# Patient Record
Sex: Male | Born: 1965
Health system: Southern US, Community
[De-identification: ages and names within clinical notes are randomized; demographics above are authoritative.]

## PROBLEM LIST (undated history)

## (undated) DIAGNOSIS — E7800 Pure hypercholesterolemia, unspecified: Secondary | ICD-10-CM

## (undated) DIAGNOSIS — I10 Essential (primary) hypertension: Secondary | ICD-10-CM

## (undated) DIAGNOSIS — K219 Gastro-esophageal reflux disease without esophagitis: Secondary | ICD-10-CM

## (undated) DIAGNOSIS — D869 Sarcoidosis, unspecified: Secondary | ICD-10-CM

## (undated) HISTORY — PX: HERNIA REPAIR: SHX51

## (undated) HISTORY — PX: FOOT SURGERY: SHX648

---

## 1999-06-04 ENCOUNTER — Emergency Department (HOSPITAL_COMMUNITY): Admission: EM | Admit: 1999-06-04 | Discharge: 1999-06-04 | Payer: Self-pay | Admitting: Emergency Medicine

## 2002-10-03 ENCOUNTER — Inpatient Hospital Stay (HOSPITAL_COMMUNITY): Admission: RE | Admit: 2002-10-03 | Discharge: 2002-10-06 | Payer: Self-pay | Admitting: Orthopedic Surgery

## 2010-10-16 ENCOUNTER — Emergency Department (HOSPITAL_BASED_OUTPATIENT_CLINIC_OR_DEPARTMENT_OTHER)
Admission: EM | Admit: 2010-10-16 | Discharge: 2010-10-17 | Disposition: A | Discharge status: Home / Self Care | Payer: Managed Care, Other (non HMO) | Attending: Emergency Medicine | Admitting: Emergency Medicine

## 2010-10-16 ENCOUNTER — Emergency Department (INDEPENDENT_AMBULATORY_CARE_PROVIDER_SITE_OTHER): Payer: Managed Care, Other (non HMO)

## 2010-10-16 DIAGNOSIS — R1310 Dysphagia, unspecified: Secondary | ICD-10-CM | POA: Insufficient documentation

## 2010-10-16 DIAGNOSIS — R109 Unspecified abdominal pain: Secondary | ICD-10-CM | POA: Insufficient documentation

## 2010-10-16 DIAGNOSIS — R0602 Shortness of breath: Secondary | ICD-10-CM

## 2010-10-16 DIAGNOSIS — R0989 Other specified symptoms and signs involving the circulatory and respiratory systems: Secondary | ICD-10-CM | POA: Insufficient documentation

## 2010-10-16 DIAGNOSIS — R0789 Other chest pain: Secondary | ICD-10-CM | POA: Insufficient documentation

## 2010-10-16 DIAGNOSIS — R0609 Other forms of dyspnea: Secondary | ICD-10-CM | POA: Insufficient documentation

## 2010-10-16 DIAGNOSIS — K219 Gastro-esophageal reflux disease without esophagitis: Secondary | ICD-10-CM | POA: Insufficient documentation

## 2010-10-16 DIAGNOSIS — R079 Chest pain, unspecified: Secondary | ICD-10-CM

## 2010-10-16 LAB — COMPREHENSIVE METABOLIC PANEL
ALT: 46 U/L (ref 0–53)
AST: 36 U/L (ref 0–37)
Alkaline Phosphatase: 65 U/L (ref 39–117)
CO2: 27 mEq/L (ref 19–32)
GFR calc non Af Amer: >60 mL/min (ref >60)
Glucose, Bld: 117 mg/dL — ABNORMAL HIGH (ref 70–99)
Potassium: 4.4 mEq/L (ref 3.5–5.1)
Sodium: 143 mEq/L (ref 135–145)
Total Protein: 7.8 g/dL (ref 6.0–8.3)

## 2010-10-16 LAB — POCT CARDIAC MARKERS
CKMB, poc: 1.7 ng/mL (ref 1.0–8.0)
Myoglobin, poc: 119 ng/mL (ref 12–200)
Troponin i, poc: <0.05 ng/mL (ref 0.00–0.09)
Troponin i, poc: <0.05 ng/mL (ref 0.00–0.09)

## 2010-10-16 LAB — DIFFERENTIAL
Basophils Absolute: 0 10*3/uL (ref 0.0–0.1)
Lymphocytes Relative: 26 % (ref 12–46)
Neutro Abs: 5.2 10*3/uL (ref 1.7–7.7)
Neutrophils Relative %: 63 % (ref 43–77)

## 2010-10-16 LAB — CBC
HCT: 41.4 % (ref 39.0–52.0)
Hemoglobin: 13.9 g/dL (ref 13.0–17.0)
WBC: 8.3 10*3/uL (ref 4.0–10.5)

## 2010-10-16 LAB — LIPASE, BLOOD: Lipase: 237 U/L (ref 23–300)

## 2011-09-10 ENCOUNTER — Emergency Department (HOSPITAL_BASED_OUTPATIENT_CLINIC_OR_DEPARTMENT_OTHER)
Admission: EM | Admit: 2011-09-10 | Discharge: 2011-09-10 | Disposition: A | Discharge status: Home / Self Care | Payer: Managed Care, Other (non HMO) | Attending: Emergency Medicine | Admitting: Emergency Medicine

## 2011-09-10 ENCOUNTER — Encounter (HOSPITAL_BASED_OUTPATIENT_CLINIC_OR_DEPARTMENT_OTHER): Payer: Self-pay | Admitting: *Deleted

## 2011-09-10 ENCOUNTER — Emergency Department (INDEPENDENT_AMBULATORY_CARE_PROVIDER_SITE_OTHER): Payer: Managed Care, Other (non HMO)

## 2011-09-10 DIAGNOSIS — R5383 Other fatigue: Secondary | ICD-10-CM

## 2011-09-10 DIAGNOSIS — R0989 Other specified symptoms and signs involving the circulatory and respiratory systems: Secondary | ICD-10-CM

## 2011-09-10 DIAGNOSIS — J069 Acute upper respiratory infection, unspecified: Secondary | ICD-10-CM | POA: Insufficient documentation

## 2011-09-10 DIAGNOSIS — R05 Cough: Secondary | ICD-10-CM

## 2011-09-10 DIAGNOSIS — R630 Anorexia: Secondary | ICD-10-CM

## 2011-09-10 DIAGNOSIS — E7800 Pure hypercholesterolemia, unspecified: Secondary | ICD-10-CM | POA: Insufficient documentation

## 2011-09-10 HISTORY — DX: Sarcoidosis, unspecified: D86.9

## 2011-09-10 HISTORY — DX: Pure hypercholesterolemia, unspecified: E78.00

## 2011-09-10 MED ORDER — ALBUTEROL SULFATE HFA 108 (90 BASE) MCG/ACT IN AERS
2.0000 | INHALATION_SPRAY | RESPIRATORY_TRACT | Status: DC | PRN
  Administered 2011-09-10: 2 via RESPIRATORY_TRACT
  Filled 2011-09-10: qty 6.7

## 2011-09-10 NOTE — ED Provider Notes (Signed)
History     CSN: [id number]  Arrival date & time 09/10/11  0940   First MD Initiated Contact with Patient 09/10/11 [phone]      Chief Complaint  Patient presents with  . URI    (Consider location/radiation/quality/duration/timing/severity/associated sxs/prior treatment) HPI Pt has had a cough for the last several days.  Bringing up brown sputum.  He is concerned about pneumonia.  Patient has been having body aches. He also has had what he believed to be episodes of wheezing. Patient does smoke but does not have known history of bronchitis or COPD. He has not noticed any rash. He has not had any neck stiffness. Patient was seen at [facility] yesterday and was given a prescription for cough medication. He is concerned because it is not really helping and he is worried he could have pneumonia. Past Medical History  Diagnosis Date  . High cholesterol   . Sarcoidosis     Past Surgical History  Procedure Date  . Hernia repair   . Foot surgery     No family history on file.  History  Substance Use Topics  . Smoking status: Not on file  . Smokeless tobacco: Not on file  . Alcohol Use: No      Review of Systems  HENT: Negative for neck pain.   Respiratory: Positive for cough. Negative for shortness of breath.   Musculoskeletal: Negative for back pain.  All other systems reviewed and are negative.    Allergies  Review of patient's allergies indicates no known allergies.  Home Medications   Current Outpatient Rx  Name Route Sig Dispense Refill  . CRESTOR PO Oral Take by mouth.      BP 132/85  Pulse 67  Temp(Src) 98.7 F (37.1 C) (Oral)  Resp 24  SpO2 98%  Physical Exam  Nursing note and vitals reviewed. Constitutional: He appears well-developed and well-nourished. No distress.  HENT:  Head: Normocephalic and atraumatic.  Right Ear: External ear normal.  Left Ear: External ear normal.  Eyes: Conjunctivae are normal. Right eye exhibits no discharge.  Left eye exhibits no discharge. No scleral icterus.  Neck: Neck supple. No tracheal deviation present.  Cardiovascular: Normal rate, regular rhythm and intact distal pulses.   Pulmonary/Chest: Effort normal and breath sounds normal. No stridor. No respiratory distress. He has no wheezes. He has no rales.  Abdominal: Soft. Bowel sounds are normal. He exhibits no distension. There is no tenderness. There is no rebound and no guarding.  Musculoskeletal: He exhibits no edema and no tenderness.  Neurological: He is alert. He has normal strength. No sensory deficit. Cranial nerve deficit:  no gross defecits noted. He exhibits normal muscle tone. He displays no seizure activity. Coordination normal.  Skin: Skin is warm and dry. No rash noted.  Psychiatric: He has a normal mood and affect.    ED Course  Procedures (including critical care time)  Labs Reviewed - No data to display Dg Chest 2 View  09/10/2011  *RADIOLOGY REPORT*  Clinical Data: Productive cough and congestion.  Fatigue. Decreased appetite.  CHEST - 2 VIEW  Comparison: 10/16/2010  Findings: Midline trachea.  Normal heart size and mediastinal contours. No pleural effusion or pneumothorax.  Mildly elevated right hemidiaphragm.  Resultant low lung volumes.  This accentuates pulmonary interstitium.  Mild volume loss at the right lung base. Lungs are otherwise clear.  IMPRESSION: Low lung volumes with mild right hemidiaphragm elevation and right basilar volume loss. No acute findings.  Original Report  Authenticated By: Consuello Bossier, M.D.     1. URI (upper respiratory infection)       MDM  Patient may have a viral illness possibly influenza. Overall he does not appear to be in any significant distress. There is no evidence of pneumonia on his x-ray. On my exam he is not wheezing currently although it is possible he has had some episodes according his complaints. Your prescription for an inhaler. I counseled him on smoking cessation  patient is encouraged to follow up with her primary care Dr. if not getting any better. I did describe supportive treatment of pneumonia and signs that should prompt return to emergency department.        Celene Kras, MD 09/10/11 1044

## 2011-09-10 NOTE — ED Notes (Signed)
Flu symptoms x 3 days. Productive cough with brown sputum. Aching all over. Fever and headache. Was seen at [facility] yesterday and given Rx for cough medication.

## 2014-03-15 ENCOUNTER — Emergency Department (HOSPITAL_BASED_OUTPATIENT_CLINIC_OR_DEPARTMENT_OTHER)
Admission: EM | Admit: 2014-03-15 | Discharge: 2014-03-16 | Disposition: A | Discharge status: Home / Self Care | Payer: Managed Care, Other (non HMO) | Attending: Emergency Medicine | Admitting: Emergency Medicine

## 2014-03-15 ENCOUNTER — Encounter (HOSPITAL_BASED_OUTPATIENT_CLINIC_OR_DEPARTMENT_OTHER): Payer: Self-pay | Admitting: Emergency Medicine

## 2014-03-15 ENCOUNTER — Emergency Department (HOSPITAL_BASED_OUTPATIENT_CLINIC_OR_DEPARTMENT_OTHER): Payer: Managed Care, Other (non HMO)

## 2014-03-15 DIAGNOSIS — K219 Gastro-esophageal reflux disease without esophagitis: Secondary | ICD-10-CM | POA: Insufficient documentation

## 2014-03-15 DIAGNOSIS — Z8619 Personal history of other infectious and parasitic diseases: Secondary | ICD-10-CM | POA: Insufficient documentation

## 2014-03-15 DIAGNOSIS — Z79899 Other long term (current) drug therapy: Secondary | ICD-10-CM | POA: Insufficient documentation

## 2014-03-15 DIAGNOSIS — Z7982 Long term (current) use of aspirin: Secondary | ICD-10-CM | POA: Insufficient documentation

## 2014-03-15 DIAGNOSIS — R062 Wheezing: Secondary | ICD-10-CM | POA: Insufficient documentation

## 2014-03-15 DIAGNOSIS — I10 Essential (primary) hypertension: Secondary | ICD-10-CM | POA: Insufficient documentation

## 2014-03-15 DIAGNOSIS — Z87891 Personal history of nicotine dependence: Secondary | ICD-10-CM | POA: Insufficient documentation

## 2014-03-15 DIAGNOSIS — E7800 Pure hypercholesterolemia, unspecified: Secondary | ICD-10-CM | POA: Insufficient documentation

## 2014-03-15 HISTORY — DX: Essential (primary) hypertension: I10

## 2014-03-15 LAB — CBC
HEMATOCRIT: 43.4 % (ref 39.0–52.0)
Hemoglobin: 14.6 g/dL (ref 13.0–17.0)
MCH: 28.2 pg (ref 26.0–34.0)
MCHC: 33.6 g/dL (ref 30.0–36.0)
MCV: 83.8 fL (ref 78.0–100.0)
Platelets: 281 10*3/uL (ref 150–400)
RBC: 5.18 MIL/uL (ref 4.22–5.81)
RDW: 14.7 % (ref 11.5–15.5)
WBC: 8 10*3/uL (ref 4.0–10.5)

## 2014-03-15 LAB — TROPONIN I: Troponin I: <0.3 ng/mL (ref <0.30)

## 2014-03-15 LAB — BASIC METABOLIC PANEL
Anion gap: 18 — ABNORMAL HIGH (ref 5–15)
BUN: 15 mg/dL (ref 6–23)
CHLORIDE: 103 meq/L (ref 96–112)
CO2: 23 meq/L (ref 19–32)
CREATININE: 1.3 mg/dL (ref 0.50–1.35)
Calcium: 9.7 mg/dL (ref 8.4–10.5)
GFR calc Af Amer: 75 mL/min — ABNORMAL LOW (ref >90)
GFR calc non Af Amer: 65 mL/min — ABNORMAL LOW (ref >90)
Glucose, Bld: 102 mg/dL — ABNORMAL HIGH (ref 70–99)
Potassium: 3.8 mEq/L (ref 3.7–5.3)
Sodium: 144 mEq/L (ref 137–147)

## 2014-03-15 MED ORDER — GI COCKTAIL ~~LOC~~
30.0000 mL | Freq: Once | ORAL | Status: AC
  Administered 2014-03-15: 30 mL via ORAL
  Filled 2014-03-15: qty 30

## 2014-03-15 NOTE — ED Provider Notes (Signed)
CSN: [id number]     Arrival date & time 03/15/14  2152 History  This chart was scribed for Aletha Allebach Smitty CordsK Kawehi Hostetter-Rasch, MD, by Yevette EdwardsAngela Bracken, ED Scribe. This patient was seen in room MH07/MH07 and the patient's care was started at 11:04 PM.    None    Chief Complaint  Patient presents with  . Chest Pain    Patient is a 48 y.o. male presenting with chest pain. The history is provided by the patient. No language interpreter was used.  Chest Pain Pain location:  Substernal area Pain quality: tightness   Pain radiates to:  Does not radiate Pain radiates to the back: no   Pain severity:  Moderate Onset quality:  Sudden Duration:  90 minutes Timing:  Constant Progression:  Improving Chronicity:  Recurrent Context: eating   Relieved by:  Nothing Worsened by:  Nothing tried Ineffective treatments:  None tried Associated symptoms: cough and nausea   Associated symptoms: no fever and no palpitations   Associated symptoms comment:  Wheezing Risk factors: hypertension and male sex    HPI Comments: Erroll LunaLeroy Saephan is a 48 y.o. male, with a h/o GERD, HTN, and hyperlipidemia, who presents to the Emergency Department complaining of central chest tightness which began an hour ago ago while he was sitting at a bowling alley. The pt reports he had eaten mild chicken wings prior to development of the chest tightness.  Mr. Jenne CampusMcQueen also endorses experiencing cough, wheezing. The pt reports the wheezing is not a new symptom. He denies abdominal pain. The pt reports he takes medication for GERD, and he has h/o GERD. He also denies a h/o COPD, asthma, or a recent cold. He also denies lower extremity swelling, recent travels, or immobilization. The pt is a former smoker.   Past Medical History  Diagnosis Date  . High cholesterol   . Sarcoidosis   . Hypertension    Past Surgical History  Procedure Laterality Date  . Hernia repair    . Foot surgery     No family history on file. History  Substance Use  Topics  . Smoking status: Former Games developermoker  . Smokeless tobacco: Not on file  . Alcohol Use: No    Review of Systems  Constitutional: Negative for fever.  Respiratory: Positive for cough and wheezing.   Cardiovascular: Positive for chest pain. Negative for palpitations and leg swelling.  Gastrointestinal: Positive for nausea.  All other systems reviewed and are negative.   Allergies  Review of patient's allergies indicates no known allergies.  Home Medications   Prior to Admission medications   Medication Sig Start Date End Date Taking? Authorizing Provider  amLODipine (NORVASC) 5 MG tablet Take 5 mg by mouth daily.   Yes Historical Provider, MD  aspirin 81 MG tablet Take 160 mg by mouth daily. For headache    Historical Provider, MD  Dextromethorphan-Guaifenesin [facility](MUCINEX COUGH FOR KIDS) 5-100 MG/5ML LIQD Take by mouth every 6 (six) hours as needed.    Historical Provider, MD  HYDROcodone-homatropine (HYCODAN) 5-1.5 MG/5ML syrup Take 5 mLs by mouth every 6 (six) hours as needed. For cough    Historical Provider, MD  Pseudoeph-Doxylamine-DM-APAP (NYQUIL) 60-7.01-21-999 MG/30ML LIQD Take by mouth at bedtime as needed. For rest    Historical Provider, MD  rosuvastatin (CRESTOR) 20 MG tablet Take 20 mg by mouth daily.    Historical Provider, MD   Triage Vitals: BP 146/87  Pulse 94  Temp(Src) 97.9 F (36.6 C) (Oral)  Resp 18  Ht 5\' 11"  (  1.803 m)  Wt 240 lb (108.863 kg)  BMI 33.49 kg/m2  SpO2 96%  Physical Exam  Constitutional: He is oriented to person, place, and time. He appears well-developed. No distress.  HENT:  Head: Normocephalic.  Mouth/Throat: Oropharynx is clear and moist and mucous membranes are normal. No oropharyngeal exudate, posterior oropharyngeal edema or posterior oropharyngeal erythema.  Eyes: EOM are normal. Pupils are equal, round, and reactive to light.  Neck: Normal range of motion. Neck supple.  No stridor.   Cardiovascular: Normal rate, regular rhythm and  normal heart sounds.   No murmur heard. Pulmonary/Chest: Effort normal. No accessory muscle usage. No respiratory distress. He has no decreased breath sounds. He has no wheezes. He has no rhonchi. He has no rales.  Abdominal: Soft. Bowel sounds are increased. There is no tenderness. There is no rebound and no guarding.    Musculoskeletal: Normal range of motion. He exhibits no edema.  Lymphadenopathy:    He has no cervical adenopathy.  Neurological: He is alert and oriented to person, place, and time.  Skin: Skin is warm and dry.  Psychiatric: He has a normal mood and affect.    ED Course  Procedures (including critical care time)  DIAGNOSTIC STUDIES: Oxygen Saturation is 96% on room air, normal by my interpretation.    COORDINATION OF CARE:  11:15 PM- Discussed treatment plan with patient, and the patient agreed to the plan.   Labs Review Labs Reviewed  BASIC METABOLIC PANEL - Abnormal; Notable for the following:    Glucose, Bld 102 (*)    GFR calc non Af Amer 65 (*)    GFR calc Af Amer 75 (*)    Anion gap 18 (*)    All other components within normal limits  CBC  TROPONIN I    Imaging Review Dg Chest 2 View  03/15/2014   CLINICAL DATA:  Central chest tightness radiating into the left arm. Shortness of breath and nausea.  EXAM: CHEST  2 VIEW  COMPARISON:  09/10/2011.  FINDINGS: The heart size and mediastinal contours are stable. There are stable low lung volumes with bibasilar atelectasis. No confluent airspace opacity, pleural effusion or pneumothorax is seen. The osseous structures appear unchanged.  IMPRESSION: Stable chest.  No acute cardiopulmonary process.   Electronically Signed   By: Roxy Horseman M.D.   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  Symptoms resolved with GI cocktail.  Ddimer is negative in a very low risk patient excluding PE.  Ruled out for ACS with a negative EKG and serial troponins with change to protonix and add carafate.  Have recommended a gerd friendly diet and close follow up.  Return for DOE CP with radiation diaphoresis or any concerns.    I personally performed the services described in this documentation, which was scribed in my presence. The recorded information has been reviewed and is accurate.    Jasmine Awe, MD 03/16/14 [phone]

## 2014-03-15 NOTE — ED Notes (Signed)
Pt c/o central chest tightness radiating to the left that began 30 min pta while [place] the bowling [place]. Pt c/o SOB, nausea and dizziness.

## 2014-03-15 NOTE — ED Notes (Signed)
MD at bedside. 

## 2014-03-16 ENCOUNTER — Encounter (HOSPITAL_BASED_OUTPATIENT_CLINIC_OR_DEPARTMENT_OTHER): Payer: Self-pay | Admitting: Emergency Medicine

## 2014-03-16 LAB — D-DIMER, QUANTITATIVE: D-Dimer, Quant: <0.27 ug/mL-FEU (ref 0.00–0.48)

## 2014-03-16 LAB — TROPONIN I

## 2014-03-16 MED ORDER — PANTOPRAZOLE SODIUM 20 MG PO TBEC: 20.0000 mg | DELAYED_RELEASE_TABLET | Freq: Every day | ORAL | Status: DC

## 2014-03-16 MED ORDER — KETOROLAC TROMETHAMINE 60 MG/2ML IM SOLN
60.0000 mg | Freq: Once | INTRAMUSCULAR | Status: AC
  Administered 2014-03-16: 60 mg via INTRAMUSCULAR
  Filled 2014-03-16: qty 2

## 2014-03-16 MED ORDER — SUCRALFATE 1 GM/10ML PO SUSP: 1.0000 g | Freq: Three times a day (TID) | ORAL | Status: DC

## 2014-11-22 ENCOUNTER — Emergency Department (HOSPITAL_BASED_OUTPATIENT_CLINIC_OR_DEPARTMENT_OTHER)
Admission: EM | Admit: 2014-11-22 | Discharge: 2014-11-22 | Disposition: A | Discharge status: Home / Self Care | Payer: Managed Care, Other (non HMO) | Attending: Emergency Medicine | Admitting: Emergency Medicine

## 2014-11-22 ENCOUNTER — Encounter (HOSPITAL_BASED_OUTPATIENT_CLINIC_OR_DEPARTMENT_OTHER): Payer: Self-pay

## 2014-11-22 DIAGNOSIS — Z9889 Other specified postprocedural states: Secondary | ICD-10-CM | POA: Insufficient documentation

## 2014-11-22 DIAGNOSIS — Z87891 Personal history of nicotine dependence: Secondary | ICD-10-CM | POA: Insufficient documentation

## 2014-11-22 DIAGNOSIS — R1030 Lower abdominal pain, unspecified: Secondary | ICD-10-CM | POA: Diagnosis not present

## 2014-11-22 DIAGNOSIS — Z8639 Personal history of other endocrine, nutritional and metabolic disease: Secondary | ICD-10-CM | POA: Insufficient documentation

## 2014-11-22 DIAGNOSIS — I10 Essential (primary) hypertension: Secondary | ICD-10-CM | POA: Insufficient documentation

## 2014-11-22 DIAGNOSIS — R1900 Intra-abdominal and pelvic swelling, mass and lump, unspecified site: Secondary | ICD-10-CM | POA: Diagnosis present

## 2014-11-22 DIAGNOSIS — R1031 Right lower quadrant pain: Secondary | ICD-10-CM

## 2014-11-22 DIAGNOSIS — Z7982 Long term (current) use of aspirin: Secondary | ICD-10-CM | POA: Insufficient documentation

## 2014-11-22 NOTE — ED Notes (Signed)
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

## 2014-11-22 NOTE — ED Provider Notes (Signed)
CSN: [id number]     Arrival date & time 11/22/14  1121 History   First MD Initiated Contact with Patient 11/22/14 1254     Chief Complaint  Patient presents with  . Groin Swelling     (Consider location/radiation/quality/duration/timing/severity/associated sxs/prior Treatment) HPI Clifford Daniels is a 49 y.o. male with a history of umbilical and right groin hernia surgery in January 2016 comes in for evaluation of groin discomfort. 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 He characterizes the discomfort as a intermittent, sharp shooting pain that he rates as a 5/10. He reports taking ibuprofen for this discomfort which helps somewhat. He has not followed up with his surgeon for this complaint. He denies any abdominal pain, fevers, nausea or vomiting, difficulties urinating or having bowel movements. No other aggravating or modifying factors.  Past Medical History  Diagnosis Date  . High cholesterol   . Sarcoidosis   . Hypertension    Past Surgical History  Procedure Laterality Date  . Hernia repair    . Foot surgery     No family history on file. History  Substance Use Topics  . Smoking status: Former Games developer  . Smokeless tobacco: Not on file  . Alcohol Use: No    Review of Systems A 10 point review of systems was completed and was negative except for pertinent positives and negatives as mentioned in the history of present illness     Allergies  Review of patient's allergies indicates no known allergies.  Home Medications   Prior to Admission medications   Medication Sig Start Date End Date Taking? Authorizing Provider  OMEPRAZOLE PO Take by mouth.   Yes Historical Provider, MD  aspirin 81 MG tablet Take 160 mg by mouth daily. For headache    Historical Provider, MD   BP 156/88 mmHg  Pulse 76  Temp(Src) 98.6 F  (37 C) (Oral)  Resp 18  Ht  (1.803 m)  Wt 244 lb (110.678 kg)  BMI 34.05 kg/m2  SpO2 96% Physical Exam  Constitutional: He is oriented to person, place, and time. He appears well-developed and well-nourished.  HENT:  Head: Normocephalic and atraumatic.  Mouth/Throat: Oropharynx is clear and moist.  Eyes: Conjunctivae are normal. Pupils are equal, round, and reactive to light. Right eye exhibits no discharge. Left eye exhibits no discharge. No scleral icterus.  Neck: Neck supple.  Cardiovascular: Normal rate, regular rhythm and normal heart sounds.   Pulmonary/Chest: Effort normal and breath sounds normal. No respiratory distress. He has no wheezes. He has no rales.  Abdominal: Soft. He exhibits no distension and no mass. There is no tenderness. There is no rebound and no guarding.  Genitourinary:  No evidence of inguinal hernia. No appreciation of direct or indirect hernias. No obvious bulges, lesions or other deformities noted.  Musculoskeletal: He exhibits no tenderness.  Neurological: He is alert and oriented to person, place, and time.  Cranial Nerves II-XII grossly intact  Skin: Skin is warm and dry. No rash noted.  Psychiatric: He has a normal mood and affect.  Nursing note and vitals reviewed.   ED Course  Procedures (including critical care time) Labs Review Labs Reviewed - No data to display  Imaging Review No results found.   EKG Interpretation None     Meds given in ED:  Medications - No data to display  Discharge Medication  List as of 11/22/2014  1:23 PM     Filed Vitals:   11/22/14 1134 11/22/14 1324  BP: 150/83 156/88  Pulse: 94 76  Temp: 98.6 F (37 C)   TempSrc: Oral   Resp: 18 18  Height: 5\' 11"  (1.803 m)   Weight: 244 lb (110.678 kg)   SpO2: 96% 96%    MDM  Vitals stable - WNL -afebrile Pt resting comfortably in ED.  PE--benign abdominal and GU exam.  DDX--patient with mild right groin discomfort following hernia repair in  January. Provided reassurance and discussed follow-up with his surgeon for further evaluation and management of symptoms. No evidence of other acute or emergent pathology at this time. Discussed reducing strenuous activity, i.e. offloading heavy objects and continue using anti-inflammatories for discomfort.  I discussed all relevant lab findings and imaging results with pt and they verbalized understanding. Discussed f/u with PCP within 48 hrs and return precautions, pt very amenable to plan. Prior to patient discharge, I discussed and reviewed this case with Dr.Zackaowski  Final diagnoses:  Groin discomfort, right       Joycie PeekBenjamin Tamer Baughman, PA-C 11/22/14 1511  Vanetta MuldersScott Zackowski, MD 11/28/14 0002

## 2015-01-01 ENCOUNTER — Emergency Department (HOSPITAL_BASED_OUTPATIENT_CLINIC_OR_DEPARTMENT_OTHER): Payer: Managed Care, Other (non HMO)

## 2015-01-01 ENCOUNTER — Encounter (HOSPITAL_BASED_OUTPATIENT_CLINIC_OR_DEPARTMENT_OTHER): Payer: Self-pay

## 2015-01-01 ENCOUNTER — Emergency Department (HOSPITAL_BASED_OUTPATIENT_CLINIC_OR_DEPARTMENT_OTHER)
Admission: EM | Admit: 2015-01-01 | Discharge: 2015-01-01 | Disposition: A | Discharge status: Home / Self Care | Payer: Managed Care, Other (non HMO) | Attending: Emergency Medicine | Admitting: Emergency Medicine

## 2015-01-01 DIAGNOSIS — Z7982 Long term (current) use of aspirin: Secondary | ICD-10-CM | POA: Diagnosis not present

## 2015-01-01 DIAGNOSIS — Z87891 Personal history of nicotine dependence: Secondary | ICD-10-CM | POA: Diagnosis not present

## 2015-01-01 DIAGNOSIS — Y9389 Activity, other specified: Secondary | ICD-10-CM | POA: Insufficient documentation

## 2015-01-01 DIAGNOSIS — S6992XA Unspecified injury of left wrist, hand and finger(s), initial encounter: Secondary | ICD-10-CM | POA: Diagnosis not present

## 2015-01-01 DIAGNOSIS — Z8639 Personal history of other endocrine, nutritional and metabolic disease: Secondary | ICD-10-CM | POA: Insufficient documentation

## 2015-01-01 DIAGNOSIS — S3992XA Unspecified injury of lower back, initial encounter: Secondary | ICD-10-CM | POA: Diagnosis not present

## 2015-01-01 DIAGNOSIS — Y92410 Unspecified street and highway as the place of occurrence of the external cause: Secondary | ICD-10-CM | POA: Diagnosis not present

## 2015-01-01 DIAGNOSIS — Z862 Personal history of diseases of the blood and blood-forming organs and certain disorders involving the immune mechanism: Secondary | ICD-10-CM | POA: Diagnosis not present

## 2015-01-01 DIAGNOSIS — S6991XA Unspecified injury of right wrist, hand and finger(s), initial encounter: Secondary | ICD-10-CM | POA: Diagnosis not present

## 2015-01-01 DIAGNOSIS — Y998 Other external cause status: Secondary | ICD-10-CM | POA: Insufficient documentation

## 2015-01-01 DIAGNOSIS — M25532 Pain in left wrist: Secondary | ICD-10-CM

## 2015-01-01 DIAGNOSIS — M25531 Pain in right wrist: Secondary | ICD-10-CM

## 2015-01-01 MED ORDER — IBUPROFEN 200 MG PO TABS
600.0000 mg | ORAL_TABLET | Freq: Once | ORAL | Status: AC
  Administered 2015-01-01: 600 mg via ORAL
  Filled 2015-01-01 (×2): qty 1

## 2015-01-01 MED ORDER — MELOXICAM 7.5 MG PO TABS: 7.5000 mg | ORAL_TABLET | Freq: Every day | ORAL | Status: DC

## 2015-01-01 MED ORDER — HYDROCODONE-ACETAMINOPHEN 5-325 MG PO TABS
1.0000 | ORAL_TABLET | Freq: Once | ORAL | Status: AC
  Administered 2015-01-01: 1 via ORAL
  Filled 2015-01-01: qty 1

## 2015-01-01 MED ORDER — TRAMADOL HCL 50 MG PO TABS
50.0000 mg | ORAL_TABLET | Freq: Four times a day (QID) | ORAL | Status: DC | PRN
Start: 2015-01-01 — End: 2017-04-14

## 2015-01-01 MED ORDER — METHOCARBAMOL 500 MG PO TABS: 500.0000 mg | ORAL_TABLET | Freq: Two times a day (BID) | ORAL | Status: DC
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
## 2017-04-14 ENCOUNTER — Encounter (HOSPITAL_BASED_OUTPATIENT_CLINIC_OR_DEPARTMENT_OTHER): Payer: Self-pay | Admitting: *Deleted

## 2017-04-14 ENCOUNTER — Emergency Department (HOSPITAL_BASED_OUTPATIENT_CLINIC_OR_DEPARTMENT_OTHER)
Admission: EM | Admit: 2017-04-14 | Discharge: 2017-04-14 | Disposition: A | Discharge status: Home / Self Care | Payer: Managed Care, Other (non HMO) | Attending: Emergency Medicine | Admitting: Emergency Medicine

## 2017-04-14 DIAGNOSIS — Z87891 Personal history of nicotine dependence: Secondary | ICD-10-CM | POA: Insufficient documentation

## 2017-04-14 DIAGNOSIS — R519 Headache, unspecified: Secondary | ICD-10-CM

## 2017-04-14 DIAGNOSIS — R51 Headache: Secondary | ICD-10-CM | POA: Insufficient documentation

## 2017-04-14 MED ORDER — FEXOFENADINE HCL 60 MG PO TABS: 60.0000 mg | ORAL_TABLET | Freq: Two times a day (BID) | ORAL | 0 refills | Status: AC

## 2017-04-14 MED ORDER — NAPROXEN 500 MG PO TABS: 500.0000 mg | ORAL_TABLET | Freq: Two times a day (BID) | ORAL | 0 refills | Status: DC

## 2017-04-14 NOTE — ED Triage Notes (Signed)
Intermittent pressure pain in head over the last 2 days.  Reports it is 'toothache' pain but not in teeth.

## 2017-04-14 NOTE — Discharge Instructions (Signed)
Please follow up with your Primary caregiver for persistent symptoms. If you develop worsening or new concerning symptoms you can return to the emergency department for re-evaluation.   Your blood pressure was elevated during today's visit. Please discuss this with your PCP during your follow-up appointment to determine if a medication adjustment/addition is needed for this

## 2017-04-14 NOTE — ED Provider Notes (Signed)
[facility]   CSN: [id number] Arrival date & time: 04/14/17  1809     History   Chief Complaint Chief Complaint  Patient presents with  . Headache    HPI Clifford Daniels is a 51 y.o. male who presents to the emergency department today for intermittent pain in his head over the last 2 days. This is a new problem. The patient states that anytime that he sneezes he feels a pain in the top of his right head that feels like a toothache, rate, lasts for <5seconds and then remits. The pain is not brought on by anything else. He has not taken anything for this. He denies any current pain. He notes that he has had itchy watery eyes, rhinorrhea and increased sneezing over the last few days since moving to a new home. He denies prodrome, aura, fever, chills, neck pain or stiffness, trauma, dizziness, syncope, lightheadedness, jaw claudication, numbness/tingling of the upper extremities, or visual changes.   HPI  Past Medical History:  Diagnosis Date  . High cholesterol   . Sarcoidosis     There are no active problems to display for this patient.   Past Surgical History:  Procedure Laterality Date  . FOOT SURGERY    . HERNIA REPAIR         Home Medications    Prior to Admission medications   Medication Sig Start Date End Date Taking? Authorizing Provider  fexofenadine (ALLEGRA) 60 MG tablet Take 1 tablet (60 mg total) by mouth 2 (two) times daily. 04/14/17   Drago Hammonds, Elmer Sow, PA-C  naproxen (NAPROSYN) 500 MG tablet Take 1 tablet (500 mg total) by mouth 2 (two) times daily. 04/14/17   Ryin Schillo, Elmer Sow, PA-C  OMEPRAZOLE PO Take by mouth.    [provider]    Family History History reviewed. No pertinent family history.  Social History Social History  Substance Use Topics  . Smoking status: Former Games developer  . Smokeless tobacco: Not on file  . Alcohol use No     Allergies   Patient has no known allergies.   Review of Systems Review of  Systems  All other systems reviewed and are negative.    Physical Exam Updated Vital Signs BP (!) 139/95 (BP Location: Right Arm)   Pulse 62   Temp 98.2 F (36.8 C) (Oral)   Resp 16   Ht 5\' 11"  (1.803 m)   Wt 110.2 kg (243 lb)   SpO2 98%   BMI 33.89 kg/m   Physical Exam  Constitutional: He appears well-developed and well-nourished.  HENT:  Head: Normocephalic and atraumatic. Head is without raccoon's eyes and without Battle's sign.  Right Ear: Hearing, tympanic membrane, external ear and ear canal normal.  Left Ear: Hearing, tympanic membrane, external ear and ear canal normal.  Nose: Mucosal edema present.  Mouth/Throat: Uvula is midline, oropharynx is clear and moist and mucous membranes are normal. No tonsillar exudate.  Tender to palpation on the top of right head following down to right neck. No temporal or TMJ tenderness.   Eyes: Pupils are equal, round, and reactive to light. Conjunctivae and EOM are normal. Right eye exhibits no discharge. Left eye exhibits no discharge. No scleral icterus.  Neck: Trachea normal, normal range of motion and phonation normal. Neck supple. Muscular tenderness (right side) present. No spinous process tenderness present. No neck rigidity. Normal range of motion present.  Cardiovascular: Normal rate, regular rhythm and intact distal pulses.   No murmur heard. Pulses:  Radial pulses are 2+ on the right side, and 2+ on the left side.       Dorsalis pedis pulses are 2+ on the right side, and 2+ on the left side.       Posterior tibial pulses are 2+ on the right side, and 2+ on the left side.  No lower extremity swelling or edema. Calves symmetric in size bilaterally.  Pulmonary/Chest: Effort normal and breath sounds normal. He exhibits no tenderness.  Abdominal: Soft. Bowel sounds are normal. There is no tenderness. There is no rebound and no guarding.  Musculoskeletal: He exhibits no edema.  Lymphadenopathy:    He has no cervical  adenopathy.  Neurological: He is alert.  Speech clear. Follows commands. No facial droop. PERRLA. EOMI. Normal peripheral fields. CN III-XII intact.  Grossly moves all extremities 4 without ataxia. Coordination intact. Able and appropriate strength for age to upper and lower extremities bilaterally including grip strength. Sensation to light touch intact bilaterally for upper and lower. Patellar deep tendon reflex 2+ and equal bilaterally. Normal finger to nose and rapid alternating movements. Normal heel to shin balance. Negative Romberg. No pronator drift. Normal gait.   Skin: Skin is warm and dry. No rash noted. He is not diaphoretic.  Psychiatric: He has a normal mood and affect.  Nursing note and vitals reviewed.    ED Treatments / Results  Labs (all labs ordered are listed, but only abnormal results are displayed) Labs Reviewed - No data to display  EKG  EKG Interpretation None       Radiology No results found.  Procedures Procedures (including critical care time)  Medications Ordered in ED Medications - No data to display   Initial Impression / Assessment and Plan / ED Course  I have reviewed the triage vital signs and the nursing notes.  Pertinent labs & imaging results that were available during my care of the patient were reviewed by me and considered in my medical decision making (see chart for details).     Patient with head pain brought on with sneezing. No current pain. Presentation non concerning for [facility], ICH, Meningitis, or temporal arteritis. Pt is afebrile with no focal neuro deficits, nuchal rigidity, or change in vision. Patient has tenderness to right scalp to right neck. May be MSK in nature. Patient also with allergic rhinitis changes + reported signs and symptoms noted. Will give patient NSAID and 2nd gen antihistamine.  The evaluation does not show pathology that would require ongoing emergent intervention or inpatient treatment. I advised the patient  to follow-up with PCP this week. I advised the patient to return to the emergency department with new or worsening symptoms or new concerns. Specific return precautions discussed. The patient verbalized understanding and agreement with plan. All questions answered. No further questions at this time. The patient is hemodynamically stable, mentating appropriately and appears safe for discharge.  Patient case discussed with Dr. Eudelia Bunch who is in agreement with plan.   Final Clinical Impressions(s) / ED Diagnoses   Final diagnoses:  Nonintractable headache, unspecified chronicity pattern, unspecified headache type    New Prescriptions Discharge Medication List as of 04/14/2017  8:17 PM    START taking these medications   Details  fexofenadine (ALLEGRA) 60 MG tablet Take 1 tablet (60 mg total) by mouth 2 (two) times daily., Starting Tue 04/14/2017, Print    naproxen (NAPROSYN) 500 MG tablet Take 1 tablet (500 mg total) by mouth 2 (two) times daily., Starting Tue 04/14/2017, Print  Princella Pellegrini 04/15/17 1610    Nira Conn, MD 04/15/17 [id number]

## 2017-07-27 ENCOUNTER — Emergency Department (HOSPITAL_BASED_OUTPATIENT_CLINIC_OR_DEPARTMENT_OTHER)
Admission: EM | Admit: 2017-07-27 | Discharge: 2017-07-27 | Disposition: A | Discharge status: Home / Self Care | Payer: Managed Care, Other (non HMO) | Attending: Emergency Medicine | Admitting: Emergency Medicine

## 2017-07-27 ENCOUNTER — Encounter (HOSPITAL_BASED_OUTPATIENT_CLINIC_OR_DEPARTMENT_OTHER): Payer: Self-pay | Admitting: Emergency Medicine

## 2017-07-27 ENCOUNTER — Other Ambulatory Visit: Payer: Self-pay

## 2017-07-27 ENCOUNTER — Emergency Department (HOSPITAL_BASED_OUTPATIENT_CLINIC_OR_DEPARTMENT_OTHER): Payer: Managed Care, Other (non HMO)

## 2017-07-27 DIAGNOSIS — Y929 Unspecified place or not applicable: Secondary | ICD-10-CM | POA: Diagnosis not present

## 2017-07-27 DIAGNOSIS — Y998 Other external cause status: Secondary | ICD-10-CM | POA: Insufficient documentation

## 2017-07-27 DIAGNOSIS — S3992XA Unspecified injury of lower back, initial encounter: Secondary | ICD-10-CM | POA: Diagnosis present

## 2017-07-27 DIAGNOSIS — Y939 Activity, unspecified: Secondary | ICD-10-CM | POA: Diagnosis not present

## 2017-07-27 DIAGNOSIS — X58XXXA Exposure to other specified factors, initial encounter: Secondary | ICD-10-CM | POA: Insufficient documentation

## 2017-07-27 DIAGNOSIS — Z79899 Other long term (current) drug therapy: Secondary | ICD-10-CM | POA: Insufficient documentation

## 2017-07-27 DIAGNOSIS — Z87891 Personal history of nicotine dependence: Secondary | ICD-10-CM | POA: Diagnosis not present

## 2017-07-27 DIAGNOSIS — R350 Frequency of micturition: Secondary | ICD-10-CM | POA: Diagnosis not present

## 2017-07-27 DIAGNOSIS — T148XXA Other injury of unspecified body region, initial encounter: Secondary | ICD-10-CM

## 2017-07-27 LAB — URINALYSIS, ROUTINE W REFLEX MICROSCOPIC
BILIRUBIN URINE: NEGATIVE
Glucose, UA: NEGATIVE mg/dL
HGB URINE DIPSTICK: NEGATIVE
Ketones, ur: NEGATIVE mg/dL
Leukocytes, UA: NEGATIVE
Nitrite: NEGATIVE
PROTEIN: NEGATIVE mg/dL
Specific Gravity, Urine: 1.025 (ref 1.005–1.030)
pH: 6 (ref 5.0–8.0)

## 2017-07-27 LAB — CBC WITH DIFFERENTIAL/PLATELET
BASOS ABS: 0 10*3/uL (ref 0.0–0.1)
BASOS PCT: 0 %
EOS ABS: 0.2 10*3/uL (ref 0.0–0.7)
Eosinophils Relative: 4 %
HCT: 43.2 % (ref 39.0–52.0)
Hemoglobin: 14 g/dL (ref 13.0–17.0)
LYMPHS ABS: 2 10*3/uL (ref 0.7–4.0)
Lymphocytes Relative: 35 %
MCH: 27.1 pg (ref 26.0–34.0)
MCHC: 32.4 g/dL (ref 30.0–36.0)
MCV: 83.7 fL (ref 78.0–100.0)
Monocytes Absolute: 0.4 10*3/uL (ref 0.1–1.0)
Monocytes Relative: 7 %
NEUTROS PCT: 54 %
Neutro Abs: 3 10*3/uL (ref 1.7–7.7)
PLATELETS: 295 10*3/uL (ref 150–400)
RBC: 5.16 MIL/uL (ref 4.22–5.81)
RDW: 13.7 % (ref 11.5–15.5)
WBC: 5.6 10*3/uL (ref 4.0–10.5)

## 2017-07-27 LAB — BASIC METABOLIC PANEL
Anion gap: 6 (ref 5–15)
BUN: 13 mg/dL (ref 6–20)
CHLORIDE: 107 mmol/L (ref 101–111)
CO2: 26 mmol/L (ref 22–32)
CREATININE: 1.1 mg/dL (ref 0.61–1.24)
Calcium: 9.2 mg/dL (ref 8.9–10.3)
GFR calc Af Amer: >60 mL/min (ref >60)
Glucose, Bld: 99 mg/dL (ref 65–99)
Potassium: 3.8 mmol/L (ref 3.5–5.1)
SODIUM: 139 mmol/L (ref 135–145)

## 2017-07-27 MED ORDER — KETOROLAC TROMETHAMINE 30 MG/ML IJ SOLN
30.0000 mg | Freq: Once | INTRAMUSCULAR | Status: AC
  Administered 2017-07-27: 30 mg via INTRAVENOUS
  Filled 2017-07-27: qty 1

## 2017-07-27 MED ORDER — SODIUM CHLORIDE 0.9 % IV BOLUS (SEPSIS)
1000.0000 mL | Freq: Once | INTRAVENOUS | Status: AC
  Administered 2017-07-27: 1000 mL via INTRAVENOUS

## 2017-07-27 MED ORDER — SODIUM CHLORIDE 0.9 % IV BOLUS (SEPSIS): 1000.0000 mL | Freq: Once | INTRAVENOUS | Status: DC

## 2017-07-27 MED ORDER — CYCLOBENZAPRINE HCL 10 MG PO TABS: 10.0000 mg | ORAL_TABLET | Freq: Two times a day (BID) | ORAL | 0 refills | Status: DC | PRN

## 2017-07-27 MED FILL — CYCLOBENZAPRINE 10 MG TABLE: 10 | 10 days supply | Qty: 20 | Fill #0

## 2017-07-27 NOTE — ED Triage Notes (Signed)
Left flank pain

## 2017-07-27 NOTE — Discharge Instructions (Signed)
You can take Tylenol or Ibuprofen as directed for pain. You can alternate Tylenol and Ibuprofen every 4 hours. If you take Tylenol at 1pm, then you can take Ibuprofen at 5pm. Then you can take Tylenol again at 9pm.   Take Flexeril as prescribed. This medication will make you drowsy so do not drive or drink alcohol when taking it.  Follow-up with your primary care doctor in the next 24-48 hours.  If you do not have a primary care doctor, you can use the clinic listed in the paperwork.  Return to the emergency department for any worsening pain, fever, numbness/weakness of your arms or legs, difficulty walking, pain with urination, blood in urine, fevers or any other worsening or concerning symptoms.

## 2017-07-27 NOTE — ED Provider Notes (Signed)
[facility] EMERGENCY DEPARTMENT Provider Note   CSN: [id number] Arrival date & time: 07/27/17  [id number]     History   Chief Complaint Chief Complaint  Patient presents with  . Back Pain    HPI Clifford Daniels is a 51 y.o. male resents for evaluation of 3 days of progressively worsening lower back pain, left greater than right.  She also reports he has been experiencing increased urinary frequency but states that this is an ongoing issue.  Patient reports that the lower back pain is intermittent and describes it as a sharp pain.  He has not been taking any medications for the pain.  He states that the pain is worsened with ambulation but denies any difficulty in bleeding.  Patient also reports that the pain radiates down into the left flank and into the left groin.  He denies any pain with urination or hematuria. Denies fevers, weight loss, numbness/weakness of upper and lower extremities, bowel/bladder incontinence, saddle anesthesia, history of back surgery, history of IVDA.   Patient denies any fevers, perineal pain, chest pain, S OB, vomiting, abdominal pain, testicular pain, penile pain.  The history is provided by the patient.    Past Medical History:  Diagnosis Date  . High cholesterol   . Sarcoidosis     There are no active problems to display for this patient.   Past Surgical History:  Procedure Laterality Date  . FOOT SURGERY    . HERNIA REPAIR         Home Medications    Prior to Admission medications   Medication Sig Start Date End Date Taking? Authorizing Provider  cyclobenzaprine (FLEXERIL) 10 MG tablet Take 1 tablet (10 mg total) by mouth 2 (two) times daily as needed for muscle spasms. 07/27/17   Maxwell CaulLayden, Kurtiss Wence A, PA-C  fexofenadine (ALLEGRA) 60 MG tablet Take 1 tablet (60 mg total) by mouth 2 (two) times daily. 04/14/17   Maczis, Elmer SowMichael M, PA-C  naproxen (NAPROSYN) 500 MG tablet Take 1 tablet (500 mg total) by mouth 2 (two) times daily. 04/14/17    Maczis, Elmer SowMichael M, PA-C  OMEPRAZOLE PO Take by mouth.    [provider]    Family History No family history on file.  Social History Social History   Tobacco Use  . Smoking status: Former Smoker  Substance Use Topics  . Alcohol use: No  . Drug use: No     Allergies   Patient has no allergy information on record.   Review of Systems Review of Systems  Constitutional: Negative for fever.  Respiratory: Negative for shortness of breath.   Cardiovascular: Negative for chest pain.  Gastrointestinal: Negative for abdominal pain, diarrhea, nausea and vomiting.  Genitourinary: Positive for flank pain and frequency. Negative for dysuria and hematuria.  Musculoskeletal: Positive for back pain. Negative for neck pain.     Physical Exam Updated Vital Signs BP 128/87 (BP Location: Right Arm)   Pulse 70   Resp 20   Ht 5\' 11"  (1.803 m)   Wt 111.1 kg (245 lb)   SpO2 98%   BMI 34.17 kg/m   Physical Exam  Constitutional: He is oriented to person, place, and time. He appears well-developed and well-nourished.  HENT:  Head: Normocephalic and atraumatic.  Mouth/Throat: Oropharynx is clear and moist and mucous membranes are normal.  Eyes: Conjunctivae, EOM and lids are normal. Pupils are equal, round, and reactive to light.  Neck: Full passive range of motion without pain.  Cardiovascular: Normal rate, regular  rhythm, normal heart sounds and normal pulses. Exam reveals no gallop and no friction rub.  No murmur heard. Pulmonary/Chest: Effort normal and breath sounds normal.  Abdominal: Soft. Normal appearance. There is no tenderness. There is CVA tenderness. There is no rigidity and no guarding. Hernia confirmed negative in the right inguinal area.  Abdomen is soft, nondistended, nontender.  Left CVA tenderness.  Genitourinary: Penis normal. Right testis shows no swelling and no tenderness. Left testis shows no swelling and no tenderness. Circumcised.  Genitourinary  Comments: The exam was performed with a chaperone present.  Musculoskeletal: Normal range of motion.       Thoracic back: He exhibits no tenderness.       Lumbar back: He exhibits no tenderness.       Arms: No midline T or L-spine tenderness to palpation.  Diffuse muscular tenderness overlying the paraspinal muscles of the lumbar region that extends over the left flank.   Neurological: He is alert and oriented to person, place, and time.  Follows commands, Moves all extremities  5/5 strength to BUE and BLE  Sensation intact throughout all major nerve distributions  Skin: Skin is warm and dry. Capillary refill takes less than 2 seconds.  Psychiatric: He has a normal mood and affect. His speech is normal.  Nursing note and vitals reviewed.    ED Treatments / Results  Labs (all labs ordered are listed, but only abnormal results are displayed) Labs Reviewed  URINALYSIS, ROUTINE W REFLEX MICROSCOPIC  BASIC METABOLIC PANEL  CBC WITH DIFFERENTIAL/PLATELET    EKG  EKG Interpretation None       Radiology Ct Renal Stone Study  Result Date: 07/27/2017 CLINICAL DATA:  Flank pain with stone disease suspected. EXAM: CT ABDOMEN AND PELVIS WITHOUT CONTRAST TECHNIQUE: Multidetector CT imaging of the abdomen and pelvis was performed following the standard protocol without IV contrast. COMPARISON:  08/07/2014 FINDINGS: Lower chest: Calcified granulomas in the left lower lobe bronchial lymph nodes. 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 Hepatobiliary: No focal liver abnormality.No evidence of biliary obstruction or stone. Pancreas: Unremarkable. Spleen: Unremarkable. Adrenals/Urinary Tract: Negative adrenals. No hydronephrosis or stone. Unremarkable bladder. Stomach/Bowel: No obstruction. No appendicitis. At least 1 sigmoid diverticulum. Vascular/Lymphatic: No acute vascular abnormality. No mass or adenopathy. Borderline haziness of fat in the central small bowel  mesentery that is nonspecific. No associated adenopathy or mass. Reproductive:Negative. Other: No ascites or pneumoperitoneum. Right inguinal and umbilical hernia repair. The umbilical hernia mesh is outward bulging. Musculoskeletal: No acute or aggressive finding. L4-5 facet arthropathy with spurring. IMPRESSION: 1. No acute finding.  No hydronephrosis or urolithiasis. 2. Umbilical and right inguinal hernia repair since 2015. Electronically Signed   By: Marnee SpringJonathon  Watts M.D.   On: 07/27/2017 10:30    Procedures Procedures (including critical care time)  Medications Ordered in ED Medications  ketorolac (TORADOL) 30 MG/ML injection 30 mg (30 mg Intravenous Given 07/27/17 0947)  sodium chloride 0.9 % bolus 1,000 mL (0 mLs Intravenous Stopped 07/27/17 1124)     Initial Impression / Assessment and Plan / ED Course  I have reviewed the triage vital signs and the nursing notes.  Pertinent labs & imaging results that were available during my care of the patient were reviewed by me and considered in my medical decision making (see chart for details).     51 year old male who presents for evaluation of 3 days of left-sided lower back pain that extends over to the left flank.  Patient reports that he  has had urinary frequency but on further review patient states that this is been an ongoing issue for the last several years.  Patient denies any dysuria, hematuria, fever. Patient is afebrile, non-toxic appearing, sitting comfortably on examination table. Vital signs reviewed and stable.  No neuro deficits noted on exam.  No red flags.  Consider kidney stone versus muscle strain.  History/physical exam is not concerning for cauda equina or spinal abscess.  History/physical exam is concerning for prostatitis.  Plan to check for evaluation of UA, CBC, BUN. Analgesics provided in the department.  UA reviewed.  Negative for any acute abnormalities.  BMP and CBC are unremarkable.  Given that patient's pain  distribution, will still obtain CT renal scan for evaluation of kidney stone.  CT renal scan is negative for any any acute evidence of kidney stone.  Discussed results with patient.  He reports some improvement of pain after analgesics.  We will plan to treat as muscle strain.  Conservative therapies discussed with patient.  Instructed patient to follow-up with primary care doctor the next 24-48 hours. Provided patient with a list of clinic resources to use if he does not have a PCP. Instructed to call them today to arrange follow-up in the next 24-48 hours. Patient had ample opportunity for questions and discussion. All patient's questions were answered with full understanding. Strict return precautions discussed. Patient expresses understanding and agreement to plan.    Final Clinical Impressions(s) / ED Diagnoses   Final diagnoses:  Muscle strain    ED Discharge Orders        Ordered    cyclobenzaprine (FLEXERIL) 10 MG tablet  2 times daily PRN     07/27/17 1114       Maxwell Caul, PA-C 07/27/17 1745    Lavera Guise, MD 07/30/17 2205

## 2017-09-22 ENCOUNTER — Encounter (HOSPITAL_BASED_OUTPATIENT_CLINIC_OR_DEPARTMENT_OTHER): Payer: Self-pay | Admitting: *Deleted

## 2017-09-22 ENCOUNTER — Emergency Department (HOSPITAL_BASED_OUTPATIENT_CLINIC_OR_DEPARTMENT_OTHER)
Admission: EM | Admit: 2017-09-22 | Discharge: 2017-09-22 | Disposition: A | Discharge status: Home / Self Care | Payer: Managed Care, Other (non HMO) | Attending: Emergency Medicine | Admitting: Emergency Medicine

## 2017-09-22 ENCOUNTER — Other Ambulatory Visit: Payer: Self-pay

## 2017-09-22 DIAGNOSIS — M79641 Pain in right hand: Secondary | ICD-10-CM | POA: Insufficient documentation

## 2017-09-22 DIAGNOSIS — Z5321 Procedure and treatment not carried out due to patient leaving prior to being seen by health care provider: Secondary | ICD-10-CM | POA: Diagnosis not present

## 2017-09-22 DIAGNOSIS — R2231 Localized swelling, mass and lump, right upper limb: Secondary | ICD-10-CM | POA: Insufficient documentation

## 2017-09-22 HISTORY — DX: Gastro-esophageal reflux disease without esophagitis: K21.9

## 2017-09-22 NOTE — ED Triage Notes (Signed)
C/o rt hand pain onset yesterday  w pain and swelling worse today,  Denies inj

## 2017-09-22 NOTE — ED Notes (Signed)
Pt eloped.

## 2018-03-28 ENCOUNTER — Other Ambulatory Visit: Payer: Self-pay

## 2018-03-28 ENCOUNTER — Emergency Department (HOSPITAL_BASED_OUTPATIENT_CLINIC_OR_DEPARTMENT_OTHER): Payer: Managed Care, Other (non HMO)

## 2018-03-28 ENCOUNTER — Encounter (HOSPITAL_BASED_OUTPATIENT_CLINIC_OR_DEPARTMENT_OTHER): Payer: Self-pay | Admitting: Emergency Medicine

## 2018-03-28 ENCOUNTER — Emergency Department (HOSPITAL_BASED_OUTPATIENT_CLINIC_OR_DEPARTMENT_OTHER)
Admission: EM | Admit: 2018-03-28 | Discharge: 2018-03-28 | Disposition: A | Discharge status: Home / Self Care | Payer: Managed Care, Other (non HMO) | Attending: Emergency Medicine | Admitting: Emergency Medicine

## 2018-03-28 DIAGNOSIS — R110 Nausea: Secondary | ICD-10-CM | POA: Insufficient documentation

## 2018-03-28 DIAGNOSIS — R1030 Lower abdominal pain, unspecified: Secondary | ICD-10-CM | POA: Diagnosis present

## 2018-03-28 DIAGNOSIS — Z87891 Personal history of nicotine dependence: Secondary | ICD-10-CM | POA: Diagnosis not present

## 2018-03-28 DIAGNOSIS — Z79899 Other long term (current) drug therapy: Secondary | ICD-10-CM | POA: Insufficient documentation

## 2018-03-28 DIAGNOSIS — R1084 Generalized abdominal pain: Secondary | ICD-10-CM | POA: Diagnosis not present

## 2018-03-28 LAB — URINALYSIS, ROUTINE W REFLEX MICROSCOPIC
Bilirubin Urine: NEGATIVE
Glucose, UA: NEGATIVE mg/dL
Hgb urine dipstick: NEGATIVE
Ketones, ur: NEGATIVE mg/dL
Leukocytes, UA: NEGATIVE
Nitrite: NEGATIVE
Protein, ur: NEGATIVE mg/dL
Specific Gravity, Urine: 1.02 (ref 1.005–1.030)
pH: 6 (ref 5.0–8.0)

## 2018-03-28 LAB — COMPREHENSIVE METABOLIC PANEL
ALT: 24 U/L (ref 0–44)
AST: 18 U/L (ref 15–41)
Albumin: 4.2 g/dL (ref 3.5–5.0)
Alkaline Phosphatase: 45 U/L (ref 38–126)
Anion gap: 7 (ref 5–15)
BUN: 15 mg/dL (ref 6–20)
CHLORIDE: 106 mmol/L (ref 98–111)
CO2: 26 mmol/L (ref 22–32)
Calcium: 8.8 mg/dL — ABNORMAL LOW (ref 8.9–10.3)
Creatinine, Ser: 0.96 mg/dL (ref 0.61–1.24)
GFR calc non Af Amer: >60 mL/min (ref >60)
Glucose, Bld: 95 mg/dL (ref 70–99)
Potassium: 4.1 mmol/L (ref 3.5–5.1)
SODIUM: 139 mmol/L (ref 135–145)
Total Bilirubin: 0.7 mg/dL (ref 0.3–1.2)
Total Protein: 7.6 g/dL (ref 6.5–8.1)

## 2018-03-28 LAB — CBC
HCT: 42.8 % (ref 39.0–52.0)
Hemoglobin: 14.3 g/dL (ref 13.0–17.0)
MCH: 27.7 pg (ref 26.0–34.0)
MCHC: 33.4 g/dL (ref 30.0–36.0)
MCV: 82.8 fL (ref 78.0–100.0)
Platelets: 284 10*3/uL (ref 150–400)
RBC: 5.17 MIL/uL (ref 4.22–5.81)
RDW: 14.3 % (ref 11.5–15.5)
WBC: 5.2 10*3/uL (ref 4.0–10.5)

## 2018-03-28 LAB — LIPASE, BLOOD: LIPASE: 38 U/L (ref 11–51)

## 2018-03-28 MED ORDER — SODIUM CHLORIDE 0.9 % IV BOLUS
1000.0000 mL | Freq: Once | INTRAVENOUS | Status: AC
  Administered 2018-03-28: 1000 mL via INTRAVENOUS

## 2018-03-28 MED ORDER — ONDANSETRON HCL 4 MG/2ML IJ SOLN
4.0000 mg | Freq: Once | INTRAMUSCULAR | Status: AC
  Administered 2018-03-28: 4 mg via INTRAVENOUS
  Filled 2018-03-28: qty 2

## 2018-03-28 MED ORDER — ONDANSETRON 4 MG PO TBDP: ORAL_TABLET | ORAL | 0 refills | Status: DC

## 2018-03-28 MED ORDER — IOPAMIDOL (ISOVUE-300) INJECTION 61%
100.0000 mL | Freq: Once | INTRAVENOUS | Status: AC | PRN
  Administered 2018-03-28: 100 mL via INTRAVENOUS

## 2018-03-28 NOTE — ED Provider Notes (Signed)
[facility] EMERGENCY DEPARTMENT Provider Note   CSN: [id number] Arrival date & time: 03/28/18  [id number]     History   Chief Complaint Chief Complaint  Patient presents with  . Abdominal Pain    HPI Clifford Daniels is a 52 y.o. male.  Patient is a 52 year old male with a history of hyperlipidemia, GERD and sarcoidosis who presents with abdominal pain and nausea.  He reports a 4-day history of gassy type abdominal pain.  He states it hurts mostly across his lower abdomen.  He is having normal bowel movements.  He has had some nausea but no vomiting.  He has a decreased appetite.  No known fevers.  No difficulty with urination.  No history of similar symptoms in the past.  He has had prior inguinal hernia repairs bilaterally but no other abdominal surgeries.     Past Medical History:  Diagnosis Date  . GERD (gastroesophageal reflux disease)   . High cholesterol   . Sarcoidosis     There are no active problems to display for this patient.   Past Surgical History:  Procedure Laterality Date  . FOOT SURGERY    . HERNIA REPAIR          Home Medications    Prior to Admission medications   Medication Sig Start Date End Date Taking? Authorizing Provider  losartan (COZAAR) 25 MG tablet Take 25 mg by mouth daily.   Yes [provider]  OMEPRAZOLE PO Take by mouth.   Yes [provider]  cyclobenzaprine (FLEXERIL) 10 MG tablet Take 1 tablet (10 mg total) by mouth 2 (two) times daily as needed for muscle spasms. 07/27/17   Maxwell CaulLayden, Lindsey A, PA-C  fexofenadine (ALLEGRA) 60 MG tablet Take 1 tablet (60 mg total) by mouth 2 (two) times daily. 04/14/17   Maczis, Elmer SowMichael M, PA-C  naproxen (NAPROSYN) 500 MG tablet Take 1 tablet (500 mg total) by mouth 2 (two) times daily. 04/14/17   Maczis, Elmer SowMichael M, PA-C  ondansetron (ZOFRAN ODT) 4 MG disintegrating tablet 4mg  ODT q4 hours prn nausea/vomit 03/28/18   Rolan BuccoBelfi, Imogene Gravelle, MD    Family History No family history on  file.  Social History Social History   Tobacco Use  . Smoking status: Former Games developermoker  . Smokeless tobacco: Never Used  Substance Use Topics  . Alcohol use: No  . Drug use: No     Allergies   Patient has no known allergies.   Review of Systems Review of Systems  Constitutional: Negative for chills, diaphoresis, fatigue and fever.  HENT: Negative for congestion, rhinorrhea and sneezing.   Eyes: Negative.   Respiratory: Negative for cough, chest tightness and shortness of breath.   Cardiovascular: Negative for chest pain and leg swelling.  Gastrointestinal: Positive for abdominal pain and nausea. Negative for blood in stool, diarrhea and vomiting.  Genitourinary: Negative for difficulty urinating, flank pain, frequency and hematuria.  Musculoskeletal: Negative for arthralgias and back pain.  Skin: Negative for rash.  Neurological: Negative for dizziness, speech difficulty, weakness, numbness and headaches.     Physical Exam Updated Vital Signs BP (!) 132/93 (BP Location: Left Arm)   Pulse 71   Temp 98.3 F (36.8 C) (Oral)   Resp 18   Ht 5\' 11"  (1.803 m)   Wt 109 kg (240 lb 4.8 oz)   SpO2 96%   BMI 33.52 kg/m   Physical Exam  Constitutional: He is oriented to person, place, and time. He appears well-developed and well-nourished.  HENT:  Head: Normocephalic and atraumatic.  Eyes: Pupils are equal, round, and reactive to light.  Neck: Normal range of motion. Neck supple.  Cardiovascular: Normal rate, regular rhythm and normal heart sounds.  Pulmonary/Chest: Effort normal and breath sounds normal. No respiratory distress. He has no wheezes. He has no rales. He exhibits no tenderness.  Abdominal: Soft. Bowel sounds are normal. There is generalized tenderness. There is no rebound and no guarding.  Musculoskeletal: Normal range of motion. He exhibits no edema.  Lymphadenopathy:    He has no cervical adenopathy.  Neurological: He is alert and oriented to person, place,  and time.  Skin: Skin is warm and dry. No rash noted.  Psychiatric: He has a normal mood and affect.     ED Treatments / Results  Labs (all labs ordered are listed, but only abnormal results are displayed) Labs Reviewed  COMPREHENSIVE METABOLIC PANEL - Abnormal; Notable for the following components:      Result Value   Calcium 8.8 (*)    All other components within normal limits  LIPASE, BLOOD  CBC  URINALYSIS, ROUTINE W REFLEX MICROSCOPIC    EKG None  Radiology Ct Abdomen Pelvis W Contrast  Result Date: 03/28/2018 CLINICAL DATA:  Abdominal pain for several days EXAM: CT ABDOMEN AND PELVIS WITH CONTRAST TECHNIQUE: Multidetector CT imaging of the abdomen and pelvis was performed using the standard protocol following bolus administration of intravenous contrast. CONTRAST:  ISOVUE-300 IOPAMIDOL (ISOVUE-300) INJECTION 61% COMPARISON:  07/27/2017 FINDINGS: Lower chest: No acute abnormality. Calcified hilar lymph nodes are noted consistent with prior history of sarcoidosis. Hepatobiliary: No focal liver abnormality is seen. No gallstones, gallbladder wall thickening, or biliary dilatation. Pancreas: Unremarkable. No pancreatic ductal dilatation or surrounding inflammatory changes. Spleen: Normal in size without focal abnormality. Adrenals/Urinary Tract: Adrenal glands are unremarkable. Kidneys are normal, without renal calculi, focal lesion, or hydronephrosis. Bladder is unremarkable. Stomach/Bowel: Diverticular change of the colon is noted without diverticulitis. The appendix is within normal limits. No obstructive or inflammatory changes are seen. The stomach is unremarkable. Vascular/Lymphatic: Left retroaortic renal vein is noted. No vascular calcifications are seen. No significant lymphadenopathy is noted. Reproductive: Prostate is unremarkable. Other: Prior umbilical and right inguinal hernia repair is noted. No recurrent hernia is seen. No ascites is noted. Musculoskeletal: No acute or  significant osseous findings. IMPRESSION: Chronic changes without acute abnormality. No findings to correspond with the patient's given clinical symptomatology are seen. Electronically Signed   By: Alcide Clever M.D.   On: 03/28/2018 10:57    Procedures Procedures (including critical care time)  Medications Ordered in ED Medications  ondansetron (ZOFRAN) injection 4 mg (4 mg Intravenous Given 03/28/18 0958)  sodium chloride 0.9 % bolus 1,000 mL (0 mLs Intravenous Stopped 03/28/18 1110)  iopamidol (ISOVUE-300) 61 % injection 100 mL (100 mLs Intravenous Contrast Given 03/28/18 1020)     Initial Impression / Assessment and Plan / ED Course  I have reviewed the triage vital signs and the nursing notes.  Pertinent labs & imaging results that were available during my care of the patient were reviewed by me and considered in my medical decision making (see chart for details).     Patient is a 52 year old male who presents with nausea and some abdominal bloating.  His labs are non-concerning.  There is no suggestions of gallbladder disease.  No evidence of pancreatitis.  His CT scan does not show any acute abnormality.  His urinalysis does not indicate any signs of infection.  He is feeling  better after IV fluids and antiemetics in the ED.  He was discharged home in good condition.  He was given a prescription for Zofran and advised to use a clear liquid/brat diet for the next few days.  He was instructed to follow-up with his PCP if his symptoms are not improving or return here as needed for any worsening symptoms.  Final Clinical Impressions(s) / ED Diagnoses   Final diagnoses:  Generalized abdominal pain  Nausea    ED Discharge Orders        Ordered    ondansetron (ZOFRAN ODT) 4 MG disintegrating tablet     03/28/18 1120       Rolan Bucco, MD 03/28/18 1130

## 2018-03-28 NOTE — ED Triage Notes (Signed)
Generalized abd pain with nausea x 2 days. States he feels bloated. Denies vomiting or diarrhea. Normal BM today.

## 2018-03-28 NOTE — ED Notes (Signed)
ED Provider at bedside. 

## 2019-06-20 IMAGING — CT CT ABD-PELV W/ CM
2 of 5 series · 16 of 46 positions shown, 18 images · IV contrast (APPLIED)
Comparison: 07/27/2017

CLINICAL DATA: Abdominal pain for several days

EXAM:
CT ABDOMEN AND PELVIS WITH CONTRAST
TECHNIQUE: Multidetector CT imaging of the abdomen and pelvis was performed
using the standard protocol following bolus administration of
intravenous contrast.
CONTRAST:  100mL SOICG0-J22 IOPAMIDOL (SOICG0-J22) INJECTION 61%

[Series 2: axial st · axial · 0.84mm/px · z∈[-536,-76]mm · 13 of 104 slices shown, 15 images]
[im 6/104  soft-tissue]
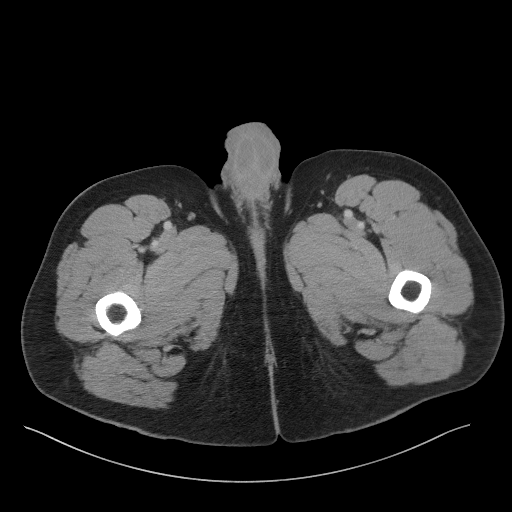
[im 6/104  bone]
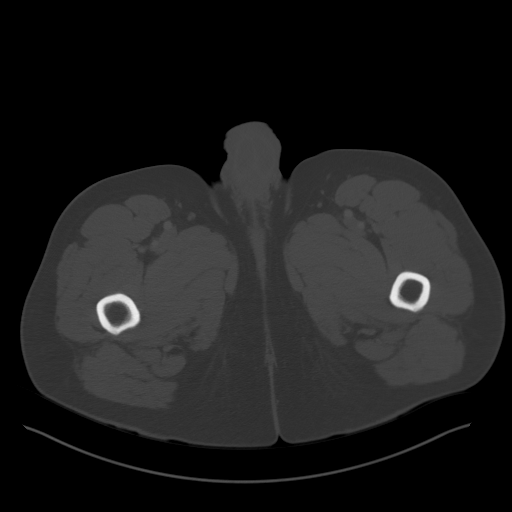
[im 17/104  soft-tissue]
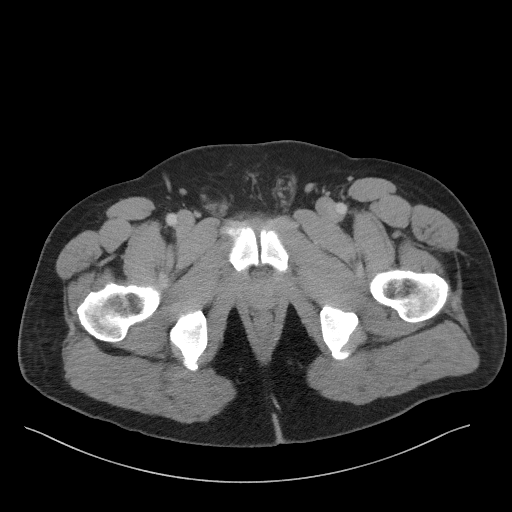
[im 22/104  soft-tissue]
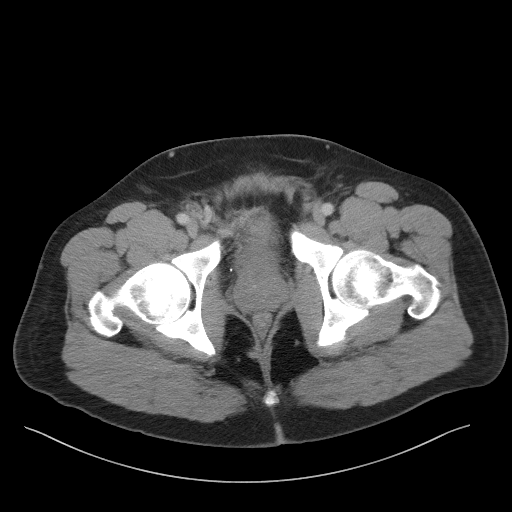
[im 28/104  soft-tissue]
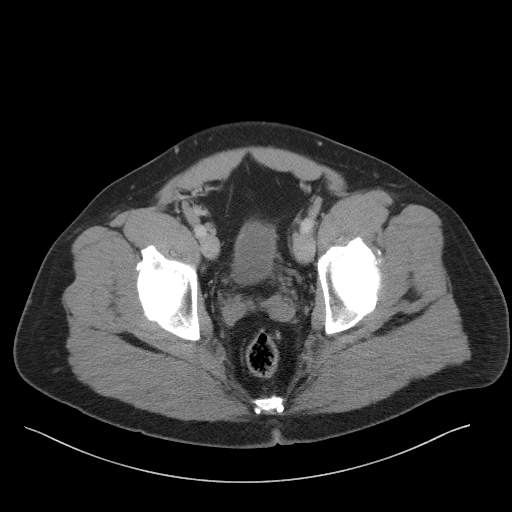
[im 38/104  soft-tissue]
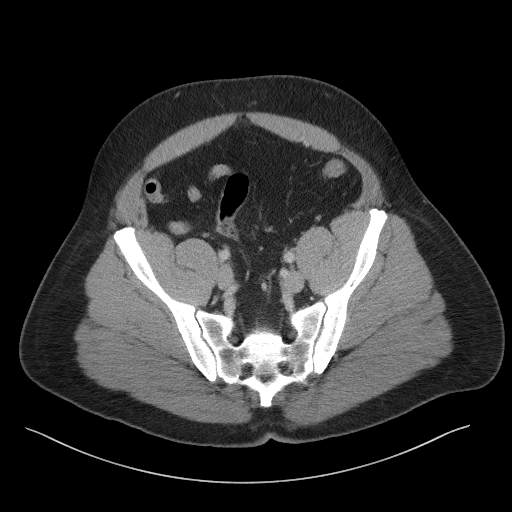
[im 44/104  soft-tissue]
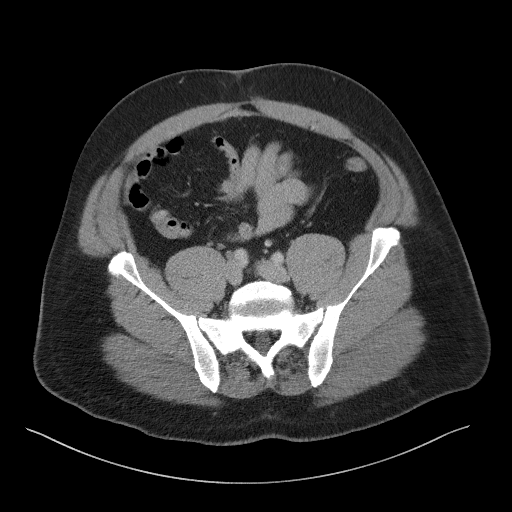
[im 55/104  soft-tissue]
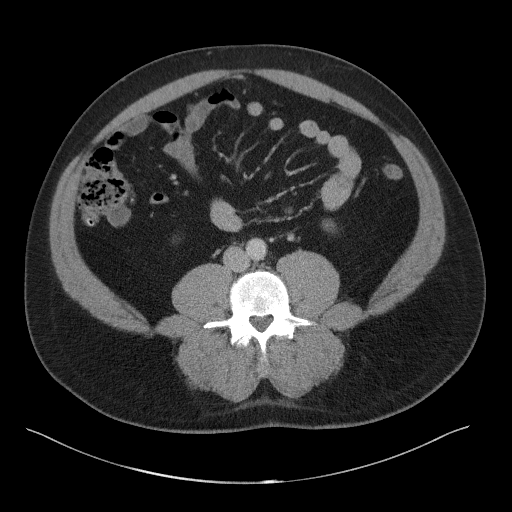
[im 60/104  soft-tissue]
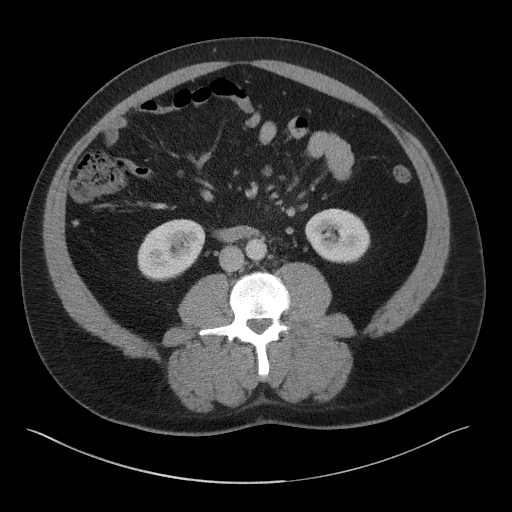
[im 66/104  soft-tissue]
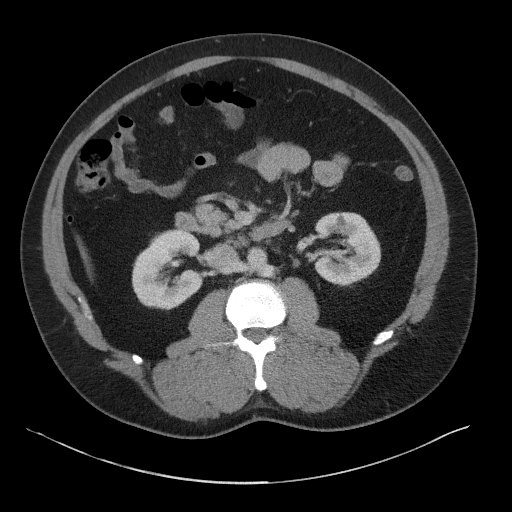
[im 66/104  bone]
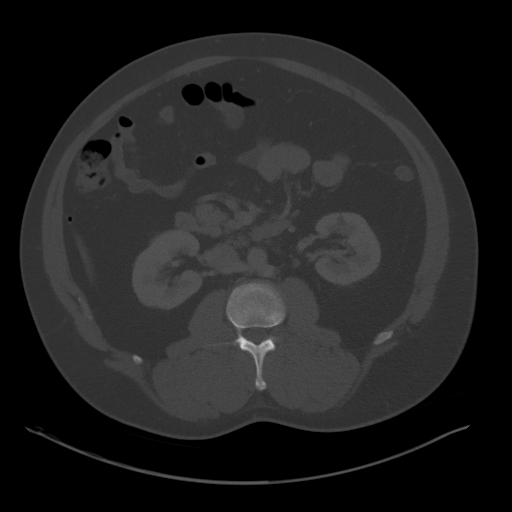
[im 76/104  soft-tissue]
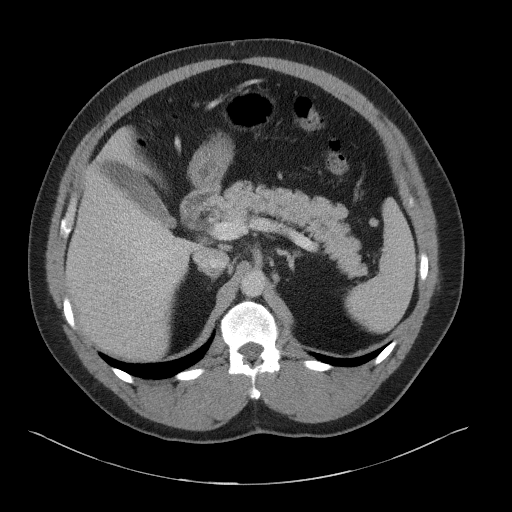
[im 82/104  soft-tissue]
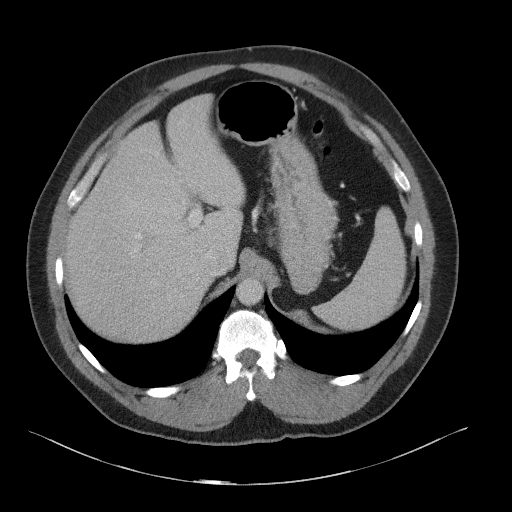
[im 87/104  soft-tissue]
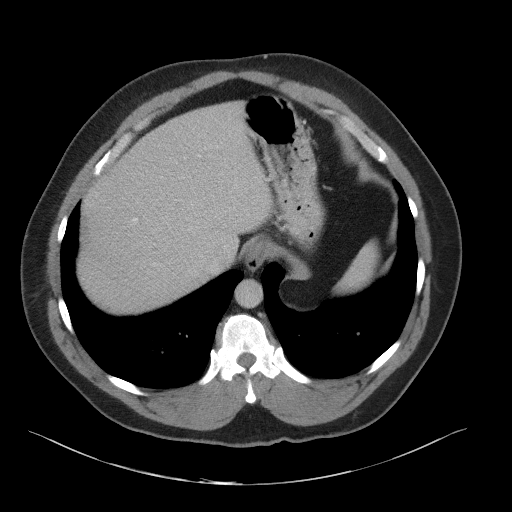
[im 98/104  soft-tissue]
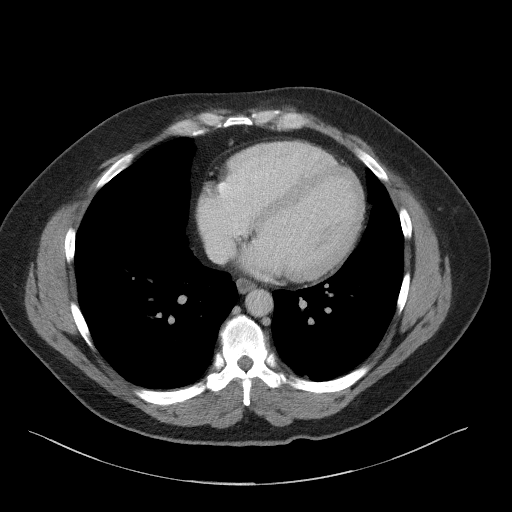

[Series 5: coronal st · coronal · 0.88mm/px · 3 of 110 slices shown]
[im 37/110  soft-tissue]
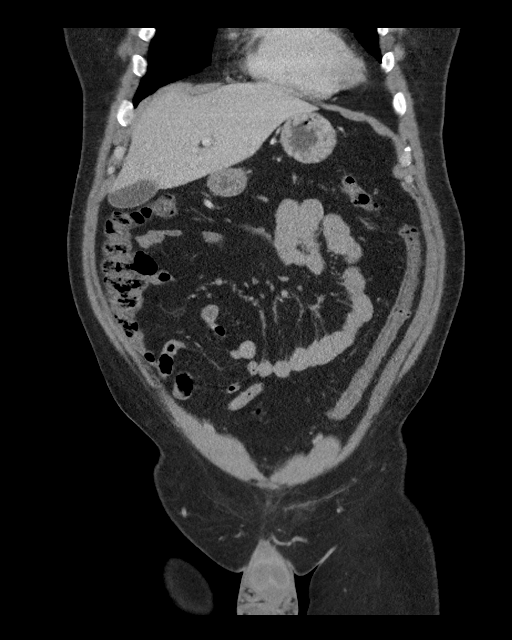
[im 49/110  soft-tissue]
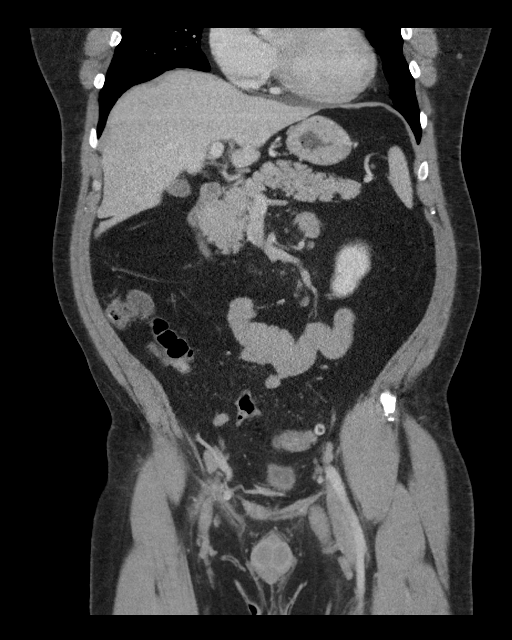
[im 61/110  soft-tissue]
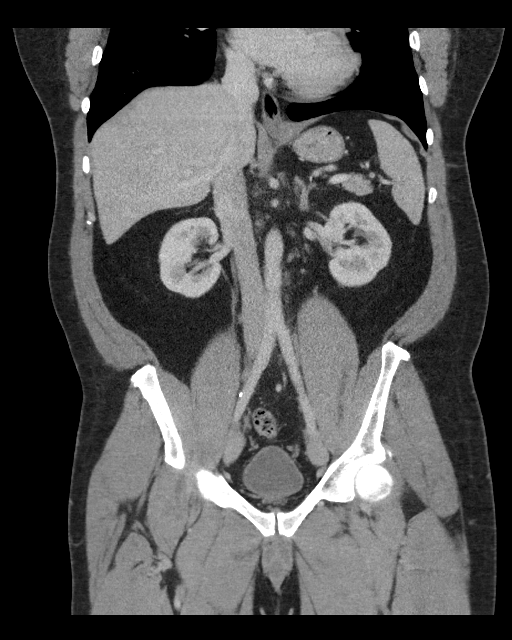

[16 of 46 positions shown; findings below may reference images not displayed]

FINDINGS: Lower chest: No acute abnormality. Calcified hilar lymph nodes are
noted consistent with prior history of sarcoidosis..

Hepatobiliary: No focal liver abnormality is seen. No gallstones,
gallbladder wall thickening, or biliary dilatation.

Pancreas: Unremarkable. No pancreatic ductal dilatation or
surrounding inflammatory changes.

Spleen: Normal in size without focal abnormality.

Adrenals/Urinary Tract: Adrenal glands are unremarkable. Kidneys are
normal, without renal calculi, focal lesion, or hydronephrosis.
Bladder is unremarkable.

Stomach/Bowel: Diverticular change of the colon is noted without
diverticulitis. The appendix is within normal limits. No obstructive
or inflammatory changes are seen. The stomach is unremarkable.

Vascular/Lymphatic: Left retroaortic renal vein is noted. No
vascular calcifications are seen. No significant lymphadenopathy is
noted.

Reproductive: Prostate is unremarkable.

Other: Prior umbilical and right inguinal hernia repair is noted. No
recurrent hernia is seen. No ascites is noted.

Musculoskeletal: No acute or significant osseous findings.
IMPRESSION: Chronic changes without acute abnormality. No findings to correspond
with the patient's given clinical symptomatology are seen.

## 2019-07-14 ENCOUNTER — Other Ambulatory Visit: Payer: Self-pay

## 2019-07-14 ENCOUNTER — Emergency Department (HOSPITAL_BASED_OUTPATIENT_CLINIC_OR_DEPARTMENT_OTHER)
Admission: EM | Admit: 2019-07-14 | Discharge: 2019-07-14 | Disposition: A | Discharge status: Home / Self Care | Payer: Managed Care, Other (non HMO) | Attending: Emergency Medicine | Admitting: Emergency Medicine

## 2019-07-14 ENCOUNTER — Emergency Department (HOSPITAL_BASED_OUTPATIENT_CLINIC_OR_DEPARTMENT_OTHER): Payer: Managed Care, Other (non HMO)

## 2019-07-14 ENCOUNTER — Encounter (HOSPITAL_BASED_OUTPATIENT_CLINIC_OR_DEPARTMENT_OTHER): Payer: Self-pay

## 2019-07-14 DIAGNOSIS — Z79899 Other long term (current) drug therapy: Secondary | ICD-10-CM | POA: Insufficient documentation

## 2019-07-14 DIAGNOSIS — X500XXA Overexertion from strenuous movement or load, initial encounter: Secondary | ICD-10-CM | POA: Diagnosis not present

## 2019-07-14 DIAGNOSIS — Y999 Unspecified external cause status: Secondary | ICD-10-CM | POA: Insufficient documentation

## 2019-07-14 DIAGNOSIS — S3991XA Unspecified injury of abdomen, initial encounter: Secondary | ICD-10-CM | POA: Diagnosis present

## 2019-07-14 DIAGNOSIS — Y92009 Unspecified place in unspecified non-institutional (private) residence as the place of occurrence of the external cause: Secondary | ICD-10-CM | POA: Diagnosis not present

## 2019-07-14 DIAGNOSIS — S39011A Strain of muscle, fascia and tendon of abdomen, initial encounter: Secondary | ICD-10-CM | POA: Insufficient documentation

## 2019-07-14 DIAGNOSIS — Z87891 Personal history of nicotine dependence: Secondary | ICD-10-CM | POA: Insufficient documentation

## 2019-07-14 DIAGNOSIS — E782 Mixed hyperlipidemia: Secondary | ICD-10-CM | POA: Insufficient documentation

## 2019-07-14 DIAGNOSIS — Y9389 Activity, other specified: Secondary | ICD-10-CM | POA: Diagnosis not present

## 2019-07-14 DIAGNOSIS — T148XXA Other injury of unspecified body region, initial encounter: Secondary | ICD-10-CM

## 2019-07-14 LAB — CBC WITH DIFFERENTIAL/PLATELET
Abs Immature Granulocytes: 0.03 10*3/uL (ref 0.00–0.07)
Basophils Absolute: 0 10*3/uL (ref 0.0–0.1)
Basophils Relative: 1 %
Eosinophils Absolute: 0.1 10*3/uL (ref 0.0–0.5)
Eosinophils Relative: 2 %
HCT: 47.6 % (ref 39.0–52.0)
Hemoglobin: 13.9 g/dL (ref 13.0–17.0)
Immature Granulocytes: 1 %
Lymphocytes Relative: 36 %
Lymphs Abs: 2.2 10*3/uL (ref 0.7–4.0)
MCH: 26.9 pg (ref 26.0–34.0)
MCHC: 29.2 g/dL — ABNORMAL LOW (ref 30.0–36.0)
MCV: 92.2 fL (ref 80.0–100.0)
Monocytes Absolute: 0.5 10*3/uL (ref 0.1–1.0)
Monocytes Relative: 8 %
Neutro Abs: 3.2 10*3/uL (ref 1.7–7.7)
Neutrophils Relative %: 52 %
Platelets: 189 10*3/uL (ref 150–400)
RBC: 5.16 MIL/uL (ref 4.22–5.81)
RDW: 13.2 % (ref 11.5–15.5)
WBC: 6 10*3/uL (ref 4.0–10.5)
nRBC: 0 % (ref 0.0–0.2)

## 2019-07-14 LAB — BASIC METABOLIC PANEL
Anion gap: 9 (ref 5–15)
BUN: 16 mg/dL (ref 6–20)
CO2: 21 mmol/L — ABNORMAL LOW (ref 22–32)
Calcium: 8.6 mg/dL — ABNORMAL LOW (ref 8.9–10.3)
Chloride: 105 mmol/L (ref 98–111)
Creatinine, Ser: 1.09 mg/dL (ref 0.61–1.24)
GFR calc Af Amer: >60 mL/min (ref >60)
GFR calc non Af Amer: >60 mL/min (ref >60)
Glucose, Bld: 82 mg/dL (ref 70–99)
Potassium: 3.7 mmol/L (ref 3.5–5.1)
Sodium: 135 mmol/L (ref 135–145)

## 2019-07-14 LAB — URINALYSIS, ROUTINE W REFLEX MICROSCOPIC
Bilirubin Urine: NEGATIVE
Glucose, UA: NEGATIVE mg/dL
Hgb urine dipstick: NEGATIVE
Ketones, ur: NEGATIVE mg/dL
Leukocytes,Ua: NEGATIVE
Nitrite: NEGATIVE
Protein, ur: NEGATIVE mg/dL
Specific Gravity, Urine: 1.025 (ref 1.005–1.030)
pH: 6 (ref 5.0–8.0)

## 2019-07-14 MED ORDER — CYCLOBENZAPRINE HCL 10 MG PO TABS: 10.0000 mg | ORAL_TABLET | Freq: Every day | ORAL | 0 refills | Status: AC

## 2019-07-14 MED ORDER — NAPROXEN 500 MG PO TABS: 500.0000 mg | ORAL_TABLET | Freq: Two times a day (BID) | ORAL | 0 refills | Status: AC | PRN

## 2019-07-14 MED ORDER — IOHEXOL 300 MG/ML  SOLN
100.0000 mL | Freq: Once | INTRAMUSCULAR | Status: AC | PRN
  Administered 2019-07-14: 14:00:00 100 mL via INTRAVENOUS

## 2019-07-14 NOTE — ED Provider Notes (Signed)
[facility] EMERGENCY DEPARTMENT Provider Note   CSN: [id number] Arrival date & time: 07/14/19  1036     History   Chief Complaint Chief Complaint  Patient presents with  . Groin Swelling    HPI Clifford Daniels is a 53 y.o. male with PMH significant for bilateral hernia surgery presents to the ED with a 5-day history of right-sided inguinal discomfort.  Patient reports that he was clearing numerous boxes from a home 6 days ago which required significant physical demand.  Shortly thereafter, he noticed the right-sided inguinal discomfort and is concerned given his history of herniorrhaphy.  He denies any fevers or chills, upper abdominal pain, nausea or vomiting, chest pain, shortness of breath, change in bowel habits, testicular pain, dysuria, or other urinary symptoms.     HPI  Past Medical History:  Diagnosis Date  . GERD (gastroesophageal reflux disease)   . High cholesterol   . Sarcoidosis     There are no active problems to display for this patient.   Past Surgical History:  Procedure Laterality Date  . FOOT SURGERY    . HERNIA REPAIR          Home Medications    Prior to Admission medications   Medication Sig Start Date End Date Taking? Authorizing Provider  cyclobenzaprine (FLEXERIL) 10 MG tablet Take 1 tablet (10 mg total) by mouth at bedtime for 10 doses. 07/14/19 07/24/19  Corena Herter, PA-C  fexofenadine (ALLEGRA) 60 MG tablet Take 1 tablet (60 mg total) by mouth 2 (two) times daily. 04/14/17   Maczis, Barth Kirks, PA-C  losartan (COZAAR) 25 MG tablet Take 25 mg by mouth daily.    [provider]  naproxen (NAPROSYN) 500 MG tablet Take 1 tablet (500 mg total) by mouth 2 (two) times daily as needed for moderate pain. 07/14/19   Corena Herter, PA-C  OMEPRAZOLE PO Take by mouth.    [provider]  ondansetron (ZOFRAN ODT) 4 MG disintegrating tablet 4mg  ODT q4 hours prn nausea/vomit 03/28/18   Malvin Johns, MD    Family  History No family history on file.  Social History Social History   Tobacco Use  . Smoking status: Former Research scientist (life sciences)  . Smokeless tobacco: Never Used  Substance Use Topics  . Alcohol use: No  . Drug use: No     Allergies   Patient has no known allergies.   Review of Systems Review of Systems  Constitutional: Negative for chills and fever.  Genitourinary: Negative for discharge, hematuria, scrotal swelling and testicular pain.     Physical Exam Updated Vital Signs BP (!) 151/88 (BP Location: Right Arm)   Pulse 72   Temp 98.3 F (36.8 C) (Oral)   Resp 18   Ht 5\' 11"  (1.803 m)   Wt 108.9 kg   SpO2 97%   BMI 33.47 kg/m   Physical Exam Vitals signs and nursing note reviewed. Exam conducted with a chaperone present.  Constitutional:      Appearance: Normal appearance.  HENT:     Head: Normocephalic and atraumatic.  Eyes:     General: No scleral icterus.    Conjunctiva/sclera: Conjunctivae normal.  Pulmonary:     Effort: Pulmonary effort is normal.  Genitourinary:    Comments: Transscrotal exam was benign.  Palpated inguinal ring and did not appreciate protruding mass.  No testicular swelling or discomfort.  No scrotal swelling.  No penile discharge. Skin:    General: Skin is dry.  Neurological:  Mental Status: He is alert.     GCS: GCS eye subscore is 4. GCS verbal subscore is 5. GCS motor subscore is 6.  Psychiatric:        Mood and Affect: Mood normal.        Behavior: Behavior normal.        Thought Content: Thought content normal.      ED Treatments / Results  Labs (all labs ordered are listed, but only abnormal results are displayed) Labs Reviewed  BASIC METABOLIC PANEL - Abnormal; Notable for the following components:      Result Value   CO2 21 (*)    Calcium 8.6 (*)    All other components within normal limits  CBC WITH DIFFERENTIAL/PLATELET - Abnormal; Notable for the following components:   MCHC 29.2 (*)    All other components within  normal limits  URINALYSIS, ROUTINE W REFLEX MICROSCOPIC    EKG None  Radiology Ct Pelvis W Contrast  Result Date: 07/14/2019 CLINICAL DATA:  Evaluate for hernia. EXAM: CT PELVIS WITH CONTRAST TECHNIQUE: Multidetector CT imaging of the pelvis was performed using the standard protocol following the bolus administration of intravenous contrast. CONTRAST:  100mL OMNIPAQUE IOHEXOL 300 MG/ML  SOLN COMPARISON:  03/28/2018 FINDINGS: Urinary Tract:  No abnormality visualized. Bowel: No abnormal bowel wall thickening, inflammation or distension. Scattered distal colonic diverticula noted. Vascular/Lymphatic: No pathologically enlarged lymph nodes. No significant vascular abnormality seen. Reproductive:  No mass or other significant abnormality Other:  There is no free fluid or fluid collections identified. Postop change from previous umbilical hernia and right inguinal hernia repair. The umbilical herniorrhaphy site appears unchanged compared with 07/27/2017. Similar appearance laxity within the right inguinal hernia mesh, image 44/4. There is a small amount of herniated fat identified within the right inguinal canal, image 45/4. This appears unchanged from comparison exam. Musculoskeletal: No suspicious bone lesions identified. IMPRESSION: 1. No acute findings. No significant change compared with 03/28/2018. 2. Status post umbilical herniorrhaphy and right inguinal herniorrhaphy. The appearance at both sites appears stable. A small amount of herniated fat is noted within the right inguinal canal, unchanged. Electronically Signed   By: Signa Kellaylor  Stroud M.D.   On: 07/14/2019 14:47    Procedures Procedures (including critical care time)  Medications Ordered in ED Medications  iohexol (OMNIPAQUE) 300 MG/ML solution 100 mL (100 mLs Intravenous Contrast Given 07/14/19 1409)     Initial Impression / Assessment and Plan / ED Course  I have reviewed the triage vital signs and the nursing notes.  Pertinent labs  & imaging results that were available during my care of the patient were reviewed by me and considered in my medical decision making (see chart for details).        While patient endorses right-sided inguinal pain and discomfort, I was unable to appreciate any masses, swelling, or obvious hernia when palpating inguinal ring.  Will obtain pelvic ultrasound with contrast to evaluate for possibly complicated hernia or other pelvic pathology.   Patient's lab work was all reassuring.  No evidence of infection or anemia.  His urinalysis was clean and his BMP demonstrated no electrolyte normalities.  CT pelvis with contrast demonstrated no significant changes when compared to CT performed 03/28/2018.  There is a small amount of herniated fat within right inguinal canal, but is unchanged from prior imaging.   Based on patient's history of recent increase physical activity and reported lower right inguinal discomfort, he may also have experienced a muscular strain from his reported  lifting.  Appearance of both herniorrhaphy sites appear stable.  No evidence of infection, abscess, or incarcerated hernia at this time.  Recommend that patient follow-up with his PCP for ongoing evaluation and management of his symptoms.  Also recommend that he continue to follow-up with the general surgeon who performed his herniorrhaphy should he continue to experience pain and discomfort.  All of the evaluation and work-up results were discussed with the patient and any family at bedside. They were provided opportunity to ask any additional questions and have none at this time. They have expressed understanding of verbal discharge instructions as well as return precautions and are agreeable to the plan.    Final Clinical Impressions(s) / ED Diagnoses   Final diagnoses:  Muscle strain    ED Discharge Orders         Ordered    naproxen (NAPROSYN) 500 MG tablet  2 times daily PRN     07/14/19 1541    cyclobenzaprine  (FLEXERIL) 10 MG tablet  Daily at bedtime     07/14/19 1541           Lorelee New, PA-C 07/14/19 1542    Rolan Bucco, MD 07/15/19 1024

## 2019-07-14 NOTE — ED Notes (Signed)
Provided urinal for urine specimen

## 2019-07-14 NOTE — Discharge Instructions (Signed)
Please call [facility] get established with a primary care provider.  Please follow-up with general surgery performed here herniorrhaphy should you continue experience pain discomfort.  Please take naproxen, as prescribed. You were given a prescription for Flexeril which is a muscle relaxer.  You should not drive, work, consume alcohol, or operate machinery while taking this medication as it can make you very drowsy.

## 2019-07-14 NOTE — ED Triage Notes (Signed)
Pt c/o right groin swelling x 2-3 days-denies injury-states he had hernia repair to area 2016-NAD-steady gait

## 2020-04-05 ENCOUNTER — Emergency Department (HOSPITAL_BASED_OUTPATIENT_CLINIC_OR_DEPARTMENT_OTHER)
Admission: EM | Admit: 2020-04-05 | Discharge: 2020-04-05 | Disposition: A | Discharge status: Home / Self Care | Payer: Managed Care, Other (non HMO) | Attending: Emergency Medicine | Admitting: Emergency Medicine

## 2020-04-05 ENCOUNTER — Encounter (HOSPITAL_BASED_OUTPATIENT_CLINIC_OR_DEPARTMENT_OTHER): Payer: Self-pay

## 2020-04-05 ENCOUNTER — Other Ambulatory Visit: Payer: Self-pay

## 2020-04-05 DIAGNOSIS — J029 Acute pharyngitis, unspecified: Secondary | ICD-10-CM | POA: Diagnosis not present

## 2020-04-05 DIAGNOSIS — Z87891 Personal history of nicotine dependence: Secondary | ICD-10-CM | POA: Diagnosis not present

## 2020-04-05 DIAGNOSIS — H9202 Otalgia, left ear: Secondary | ICD-10-CM | POA: Diagnosis not present

## 2020-04-05 MED ORDER — AMOXICILLIN 500 MG PO CAPS: 1000.0000 mg | ORAL_CAPSULE | Freq: Two times a day (BID) | ORAL | 0 refills | Status: AC

## 2020-04-05 MED FILL — AMOXICILLIN 500 MG CAPSULE: 500 | 10 days supply | Qty: 40 | Fill #0

## 2020-04-05 NOTE — Discharge Instructions (Signed)
I prescribed you amoxicillin for her ear pain.  You are going to take this twice a day for the next 10 days.  Please follow-up with your primary care provider if you feel your symptoms have not begin to improve in the next 3 to 5 days.  You can always return to the emergency department with new or worsening symptoms.  It was a pleasure to meet you both.

## 2020-04-05 NOTE — ED Triage Notes (Signed)
Arrives ambulatory with pain to left ear since yesterday with pain radiating into left neck.

## 2020-04-05 NOTE — ED Provider Notes (Signed)
[facility] EMERGENCY DEPARTMENT Provider Note   CSN: [id number] Arrival date & time: 04/05/20  8270     History Chief Complaint  Patient presents with  . Otalgia    Clifford Daniels is a 54 y.o. male.  HPI   Patient is a 54 year old male with medical history as noted below.  He states for the last 24 hours he has been experiencing mild left ear pain that radiates down his left neck.  He reports a history of otitis media and states his current symptoms are similar.  He reports history of chronic seasonal allergies with associated rhinorrhea and mild sore throat but denies this has acutely worsened.  He took aspirin last night with mild relief of his ear pain.  No fevers, chills, difficulty swallowing, chest pain, shortness of breath.     Past Medical History:  Diagnosis Date  . GERD (gastroesophageal reflux disease)   . High cholesterol   . Sarcoidosis     There are no problems to display for this patient.   Past Surgical History:  Procedure Laterality Date  . FOOT SURGERY    . HERNIA REPAIR         No family history on file.  Social History   Tobacco Use  . Smoking status: Former Games developer  . Smokeless tobacco: Never Used  Vaping Use  . Vaping Use: Never used  Substance Use Topics  . Alcohol use: No  . Drug use: No    Home Medications Prior to Admission medications   Medication Sig Start Date End Date Taking? Authorizing Provider  fexofenadine (ALLEGRA) 60 MG tablet Take 1 tablet (60 mg total) by mouth 2 (two) times daily. 04/14/17   Maczis, Elmer Sow, PA-C  losartan (COZAAR) 25 MG tablet Take 25 mg by mouth daily.    [provider]  naproxen (NAPROSYN) 500 MG tablet Take 1 tablet (500 mg total) by mouth 2 (two) times daily as needed for moderate pain. 07/14/19   Lorelee New, PA-C  OMEPRAZOLE PO Take by mouth.    [provider]  ondansetron (ZOFRAN ODT) 4 MG disintegrating tablet 4mg  ODT q4 hours prn nausea/vomit 03/28/18    05/28/18, MD    Allergies    Amlodipine and Lisinopril  Review of Systems   Review of Systems  Constitutional: Negative for chills and fever.  HENT: Positive for congestion, ear pain and sore throat. Negative for ear discharge, rhinorrhea, sinus pressure and sinus pain.   Respiratory: Negative for shortness of breath.   Cardiovascular: Negative for chest pain.   Physical Exam Updated Vital Signs BP (!) 147/95 (BP Location: Left Arm)   Pulse 72   Temp 98.2 F (36.8 C) (Oral)   Resp 18   Ht 5\' 11"  (1.803 m)   Wt 108.9 kg   SpO2 97%   BMI 33.47 kg/m   Physical Exam Vitals and nursing note reviewed.  Constitutional:      General: He is not in acute distress.    Appearance: Normal appearance. He is normal weight. He is not ill-appearing, toxic-appearing or diaphoretic.  HENT:     Head: Normocephalic and atraumatic.     Right Ear: Tympanic membrane, ear canal and external ear normal.     Left Ear: Ear canal and external ear normal. A middle ear effusion is present. Tympanic membrane is erythematous.     Ears:     Comments: No pain appreciated with manipulation of the left ear.    Nose: Nose normal.  Mouth/Throat:     Mouth: Mucous membranes are moist.     Pharynx: Oropharynx is clear. No oropharyngeal exudate or posterior oropharyngeal erythema.  Eyes:     Extraocular Movements: Extraocular movements intact.  Neck:     Comments: No cervical lymphadenopathy appreciated.  No mastoid tenderness. Cardiovascular:     Rate and Rhythm: Normal rate and regular rhythm.     Pulses: Normal pulses.     Heart sounds: Normal heart sounds. No murmur heard.  No friction rub. No gallop.   Pulmonary:     Effort: Pulmonary effort is normal. No respiratory distress.     Breath sounds: Normal breath sounds. No stridor. No wheezing, rhonchi or rales.  Abdominal:     General: Abdomen is flat.     Tenderness: There is no abdominal tenderness.  Musculoskeletal:        General:  Normal range of motion.     Cervical back: Normal range of motion and neck supple. No rigidity or tenderness.  Skin:    General: Skin is warm and dry.  Neurological:     General: No focal deficit present.     Mental Status: He is alert and oriented to person, place, and time.  Psychiatric:        Mood and Affect: Mood normal.        Behavior: Behavior normal.    ED Results / Procedures / Treatments   Labs (all labs ordered are listed, but only abnormal results are displayed) Labs Reviewed - No data to display  EKG None  Radiology No results found.  Procedures Procedures   Medications Ordered in ED Medications - No data to display  ED Course  I have reviewed the triage vital signs and the nursing notes.  Pertinent labs & imaging results that were available during my care of the patient were reviewed by me and considered in my medical decision making (see chart for details).    MDM Rules/Calculators/A&P                          Pt is a 54 y.o. male that present with a history, physical exam, ED Clinical Course as noted above.   Patient presents today with left ear pain.  Physical exam significant for middle ear effusion and an erythematous TM on the left side.  No pain noted with manipulation of the left ear.  No lymphadenopathy.  No mastoid tenderness.  We will discharge patient on a course of amoxicillin.  He verbalized understanding of this plan and is amenable.  Tylenol and ibuprofen for pain.  His questions were answered and he was amicable at time of discharge.  His vital signs are stable.  He is afebrile.  Patient discharged to home/self care.  Condition at discharge: Stable  Note: Portions of this report may have been transcribed using voice recognition software. Every effort was made to ensure accuracy; however, inadvertent computerized transcription errors may be present.    Final Clinical Impression(s) / ED Diagnoses Final diagnoses:  Otalgia of left ear      Rx / DC Orders ED Discharge Orders         Ordered    amoxicillin (AMOXIL) 500 MG capsule  2 times daily     Discontinue  Reprint     04/05/20 1408           Placido Sou, PA-C 04/05/20 1410    Arby Barrette, MD 04/17/20 1525

## 2020-05-08 ENCOUNTER — Emergency Department (HOSPITAL_BASED_OUTPATIENT_CLINIC_OR_DEPARTMENT_OTHER)
Admission: EM | Admit: 2020-05-08 | Discharge: 2020-05-08 | Disposition: A | Discharge status: Home / Self Care | Payer: Self-pay | Attending: Emergency Medicine | Admitting: Emergency Medicine

## 2020-05-08 ENCOUNTER — Emergency Department (HOSPITAL_BASED_OUTPATIENT_CLINIC_OR_DEPARTMENT_OTHER): Payer: Self-pay

## 2020-05-08 ENCOUNTER — Other Ambulatory Visit: Payer: Self-pay

## 2020-05-08 ENCOUNTER — Encounter (HOSPITAL_BASED_OUTPATIENT_CLINIC_OR_DEPARTMENT_OTHER): Payer: Self-pay

## 2020-05-08 DIAGNOSIS — Z20822 Contact with and (suspected) exposure to covid-19: Secondary | ICD-10-CM | POA: Insufficient documentation

## 2020-05-08 DIAGNOSIS — R058 Other specified cough: Secondary | ICD-10-CM

## 2020-05-08 DIAGNOSIS — Z79899 Other long term (current) drug therapy: Secondary | ICD-10-CM | POA: Insufficient documentation

## 2020-05-08 DIAGNOSIS — J069 Acute upper respiratory infection, unspecified: Secondary | ICD-10-CM | POA: Insufficient documentation

## 2020-05-08 DIAGNOSIS — Z87891 Personal history of nicotine dependence: Secondary | ICD-10-CM | POA: Insufficient documentation

## 2020-05-08 LAB — SARS CORONAVIRUS 2 BY RT PCR (HOSPITAL ORDER, PERFORMED IN ~~LOC~~ HOSPITAL LAB): SARS Coronavirus 2: NEGATIVE

## 2020-05-08 NOTE — ED Provider Notes (Signed)
[facility] EMERGENCY DEPARTMENT Provider Note   CSN: [id number] Arrival date & time: 05/08/20  1105     History Chief Complaint  Patient presents with  . Cough    Clifford Daniels is a 54 y.o. male presenting to the emergency department with complaint of productive cough and fatigue that began yesterday. He also endorses sore throat. Denies fever or body aches, loss of taste or smell, ear pain, abdominal complaints.  No known Covid exposures and he has completed Covid vaccines.  Treated symptoms at home.  The history is provided by the patient.       Past Medical History:  Diagnosis Date  . GERD (gastroesophageal reflux disease)   . High cholesterol   . Sarcoidosis     There are no problems to display for this patient.   Past Surgical History:  Procedure Laterality Date  . FOOT SURGERY    . HERNIA REPAIR         No family history on file.  Social History   Tobacco Use  . Smoking status: Former Games developer  . Smokeless tobacco: Never Used  Vaping Use  . Vaping Use: Never used  Substance Use Topics  . Alcohol use: No  . Drug use: No    Home Medications Prior to Admission medications   Medication Sig Start Date End Date Taking? Authorizing Provider  fexofenadine (ALLEGRA) 60 MG tablet Take 1 tablet (60 mg total) by mouth 2 (two) times daily. 04/14/17   Maczis, Elmer Sow, PA-C  losartan (COZAAR) 25 MG tablet Take 25 mg by mouth daily.    [provider]  naproxen (NAPROSYN) 500 MG tablet Take 1 tablet (500 mg total) by mouth 2 (two) times daily as needed for moderate pain. 07/14/19   Lorelee New, PA-C  OMEPRAZOLE PO Take by mouth.    [provider]  ondansetron (ZOFRAN ODT) 4 MG disintegrating tablet 4mg  ODT q4 hours prn nausea/vomit 03/28/18   05/28/18, MD    Allergies    Amlodipine and Lisinopril  Review of Systems   Review of Systems  Constitutional: Positive for fatigue. Negative for fever.  HENT: Positive for sore  throat.   Respiratory: Positive for cough.   All other systems reviewed and are negative.   Physical Exam Updated Vital Signs BP (!) 154/98 (BP Location: Left Arm)   Pulse 78   Temp 98.7 F (37.1 C) (Oral)   Resp 16   Ht 5\' 11"  (1.803 m)   Wt 115.7 kg   SpO2 99%   BMI 35.57 kg/m   Physical Exam Vitals and nursing note reviewed.  Constitutional:      General: He is not in acute distress.    Appearance: He is well-developed. He is not ill-appearing.  HENT:     Head: Normocephalic and atraumatic.     Mouth/Throat:     Pharynx: Posterior oropharyngeal erythema present.     Comments: Posterior oropharyngeal erythema.  No edema or exudates.  Uvula is midline, tolerating secretions.  No trismus. Eyes:     Conjunctiva/sclera: Conjunctivae normal.  Cardiovascular:     Rate and Rhythm: Normal rate and regular rhythm.  Pulmonary:     Effort: Pulmonary effort is normal. No respiratory distress.     Breath sounds: Normal breath sounds.  Musculoskeletal:     Cervical back: Normal range of motion and neck supple. No tenderness.  Lymphadenopathy:     Cervical: No cervical adenopathy.  Skin:    General: Skin is warm.  Neurological:     Mental Status: He is alert.  Psychiatric:        Behavior: Behavior normal.     ED Results / Procedures / Treatments   Labs (all labs ordered are listed, but only abnormal results are displayed) Labs Reviewed  SARS CORONAVIRUS 2 BY RT PCR (HOSPITAL ORDER, PERFORMED IN [facility])    EKG None  Radiology DG Chest 2 View  Result Date: 05/08/2020 CLINICAL DATA:  Cough and fatigue EXAM: CHEST - 2 VIEW COMPARISON:  Dec 26, 2017 FINDINGS: There is minimal left base atelectasis. Lungs elsewhere clear. Heart is upper normal in size with pulmonary vascularity normal. No adenopathy. No bone lesions. IMPRESSION: Minimal left base atelectasis. Lungs elsewhere clear. Heart upper normal in size. Electronically Signed   By: Bretta Bang  III M.D.   On: 05/08/2020 13:27    Procedures Procedures (including critical care time)  Medications Ordered in ED Medications - No data to display  ED Course  I have reviewed the triage vital signs and the nursing notes.  Pertinent labs & imaging results that were available during my care of the patient were reviewed by me and considered in my medical decision making (see chart for details).    MDM Rules/Calculators/A&P                         Clifford Daniels was evaluated in Emergency Department on 05/08/2020 for the symptoms described in the history of present illness. He was evaluated in the context of the global COVID-19 pandemic, which necessitated consideration that the patient might be at risk for infection with the SARS-CoV-2 virus that causes COVID-19. Institutional protocols and algorithms that pertain to the evaluation of patients at risk for COVID-19 are in a state of rapid change based on information released by regulatory bodies including the CDC and federal and state organizations. These policies and algorithms were followed during the patient's care in the ED.  Patients symptoms are consistent with URI, likely viral etiology. Afebrile, tolerating secretions.  Lungs clear to auscultation bilaterally. Excellent O2 sat on RA. CXR negative for acute infiltrate. COVID test is negative. Discussed that antibiotics are not indicated for viral infections. Pt will be discharged with symptomatic treatment.  Verbalizes understanding and is agreeable with plan. Pt is hemodynamically stable & in NAD prior to dc.  Final Clinical Impression(s) / ED Diagnoses Final diagnoses:  Viral URI with cough    Rx / DC Orders ED Discharge Orders    None       Aydenn Gervin, [place] N, PA-C 05/08/20 1400    Sabino Donovan, MD 05/08/20 1459

## 2020-05-08 NOTE — ED Notes (Signed)
Pt discharged to home. Discharge instructions have been discussed with patient and/or family members. Pt verbally acknowledges understanding d/c instructions, and endorses comprehension to checkout at registration before leaving.  °

## 2020-05-08 NOTE — Discharge Instructions (Addendum)
Your COVID test is negative. Your chest xray does not show a pneumonia. You can take tylenol as needed for sore throat or fever.  Drink plenty of water.  Use saline nasal spray for congestion. Follow up with your primary care provider as needed.  Return to the ER for inability to swallow liquids, difficulty breathing, or new or worsening symptoms.

## 2020-05-08 NOTE — ED Triage Notes (Signed)
Pt c/o "covid symptoms"-cough/fatigue x 2 days-NAD-steady gait

## 2020-06-14 ENCOUNTER — Encounter (HOSPITAL_BASED_OUTPATIENT_CLINIC_OR_DEPARTMENT_OTHER): Payer: Self-pay | Admitting: Emergency Medicine

## 2020-06-14 ENCOUNTER — Emergency Department (HOSPITAL_BASED_OUTPATIENT_CLINIC_OR_DEPARTMENT_OTHER): Payer: Self-pay

## 2020-06-14 ENCOUNTER — Other Ambulatory Visit: Payer: Self-pay

## 2020-06-14 ENCOUNTER — Emergency Department (HOSPITAL_BASED_OUTPATIENT_CLINIC_OR_DEPARTMENT_OTHER)
Admission: EM | Admit: 2020-06-14 | Discharge: 2020-06-14 | Disposition: A | Discharge status: Home / Self Care | Payer: Self-pay | Attending: Emergency Medicine | Admitting: Emergency Medicine

## 2020-06-14 DIAGNOSIS — R072 Precordial pain: Secondary | ICD-10-CM | POA: Insufficient documentation

## 2020-06-14 DIAGNOSIS — Z87891 Personal history of nicotine dependence: Secondary | ICD-10-CM | POA: Insufficient documentation

## 2020-06-14 LAB — TROPONIN I (HIGH SENSITIVITY)
Troponin I (High Sensitivity): 4 ng/L (ref <18)
Troponin I (High Sensitivity): 5 ng/L (ref <18)

## 2020-06-14 LAB — D-DIMER, QUANTITATIVE: D-Dimer, Quant: 0.31 ug/mL-FEU (ref 0.00–0.50)

## 2020-06-14 LAB — BASIC METABOLIC PANEL
Anion gap: 10 (ref 5–15)
BUN: 14 mg/dL (ref 6–20)
CO2: 25 mmol/L (ref 22–32)
Calcium: 9 mg/dL (ref 8.9–10.3)
Chloride: 103 mmol/L (ref 98–111)
Creatinine, Ser: 1.11 mg/dL (ref 0.61–1.24)
GFR, Estimated: >60 mL/min (ref >60)
Glucose, Bld: 94 mg/dL (ref 70–99)
Potassium: 4.1 mmol/L (ref 3.5–5.1)
Sodium: 138 mmol/L (ref 135–145)

## 2020-06-14 LAB — CBC
HCT: 41.6 % (ref 39.0–52.0)
Hemoglobin: 13.4 g/dL (ref 13.0–17.0)
MCH: 27.2 pg (ref 26.0–34.0)
MCHC: 32.2 g/dL (ref 30.0–36.0)
MCV: 84.6 fL (ref 80.0–100.0)
Platelets: 297 10*3/uL (ref 150–400)
RBC: 4.92 MIL/uL (ref 4.22–5.81)
RDW: 13.6 % (ref 11.5–15.5)
WBC: 6.6 10*3/uL (ref 4.0–10.5)
nRBC: 0 % (ref 0.0–0.2)

## 2020-06-14 NOTE — ED Provider Notes (Signed)
[facility] EMERGENCY DEPARTMENT Provider Note   CSN: [id number] Arrival date & time: 06/14/20  1165     History Chief Complaint  Patient presents with  . Chest Pain    Clifford Daniels is a 54 y.o. male.  Patient with 1 week history of constant substernal chest pain. Does wax and wane but has never gone away. No prior history of similar problem. Also associated with shortness of breath. No leg swelling. Past medical history significant for high cholesterol. Former smoker. Symptoms not made better or worse by anything. No fevers. No cough or congestion.        Past Medical History:  Diagnosis Date  . GERD (gastroesophageal reflux disease)   . High cholesterol   . Sarcoidosis     There are no problems to display for this patient.   Past Surgical History:  Procedure Laterality Date  . FOOT SURGERY    . HERNIA REPAIR         No family history on file.  Social History   Tobacco Use  . Smoking status: Former Games developer  . Smokeless tobacco: Never Used  Vaping Use  . Vaping Use: Never used  Substance Use Topics  . Alcohol use: Yes  . Drug use: No    Home Medications Prior to Admission medications   Medication Sig Start Date End Date Taking? Authorizing Provider  fexofenadine (ALLEGRA) 60 MG tablet Take 1 tablet (60 mg total) by mouth 2 (two) times daily. 04/14/17   Maczis, Elmer Sow, PA-C  losartan (COZAAR) 25 MG tablet Take 25 mg by mouth daily.    [provider]  naproxen (NAPROSYN) 500 MG tablet Take 1 tablet (500 mg total) by mouth 2 (two) times daily as needed for moderate pain. 07/14/19   Lorelee New, PA-C  OMEPRAZOLE PO Take by mouth.    [provider]  ondansetron (ZOFRAN ODT) 4 MG disintegrating tablet 4mg  ODT q4 hours prn nausea/vomit 03/28/18   05/28/18, MD    Allergies    Amlodipine and Lisinopril  Review of Systems   Review of Systems  Constitutional: Negative for chills and fever.  HENT: Negative for  rhinorrhea and sore throat.   Eyes: Negative for visual disturbance.  Respiratory: Positive for shortness of breath. Negative for cough.   Cardiovascular: Positive for chest pain. Negative for leg swelling.  Gastrointestinal: Negative for abdominal pain, diarrhea, nausea and vomiting.  Genitourinary: Negative for dysuria.  Musculoskeletal: Negative for back pain and neck pain.  Skin: Negative for rash.  Neurological: Negative for dizziness, light-headedness and headaches.  Hematological: Does not bruise/bleed easily.  Psychiatric/Behavioral: Negative for confusion.    Physical Exam Updated Vital Signs BP 138/86   Pulse 66   Temp 98.2 F (36.8 C) (Oral)   Resp (!) 21   Ht 1.803 m (5\' 11" )   Wt 112.9 kg   SpO2 100%   BMI 34.73 kg/m   Physical Exam Vitals and nursing note reviewed.  Constitutional:      Appearance: Normal appearance. He is well-developed.  HENT:     Head: Normocephalic and atraumatic.  Eyes:     Extraocular Movements: Extraocular movements intact.     Conjunctiva/sclera: Conjunctivae normal.     Pupils: Pupils are equal, round, and reactive to light.  Cardiovascular:     Rate and Rhythm: Normal rate and regular rhythm.     Heart sounds: No murmur heard.   Pulmonary:     Effort: Pulmonary effort is normal. No respiratory  distress.     Breath sounds: Normal breath sounds.  Chest:     Chest wall: No tenderness.  Abdominal:     Palpations: Abdomen is soft.     Tenderness: There is no abdominal tenderness.  Musculoskeletal:        General: Normal range of motion.     Cervical back: Normal range of motion and neck supple.  Skin:    General: Skin is warm and dry.     Capillary Refill: Capillary refill takes less than 2 seconds.  Neurological:     General: No focal deficit present.     Mental Status: He is alert and oriented to person, place, and time.     Cranial Nerves: No cranial nerve deficit.     Sensory: No sensory deficit.     Motor: No  weakness.     ED Results / Procedures / Treatments   Labs (all labs ordered are listed, but only abnormal results are displayed) Labs Reviewed  BASIC METABOLIC PANEL  CBC  D-DIMER, QUANTITATIVE (NOT AT [facility])  TROPONIN I (HIGH SENSITIVITY)  TROPONIN I (HIGH SENSITIVITY)    EKG EKG Interpretation  Date/Time:  Thursday June 14 2020 09:38:19 EDT Ventricular Rate:  81 PR Interval:    QRS Duration: 93 QT Interval:  362 QTC Calculation: 421 R Axis:   60 Text Interpretation: Sinus rhythm Confirmed by Vanetta Mulders [phone]) on 06/14/2020 9:43:50 AM   Radiology DG Chest 2 View  Result Date: 06/14/2020 CLINICAL DATA:  Chest pain, dyspnea EXAM: CHEST - 2 VIEW COMPARISON:  05/08/2020 FINDINGS: Lungs are well expanded, symmetric, and clear. No pneumothorax or pleural effusion. Cardiac size within normal limits. Pulmonary vascularity is normal. Osseous structures are age-appropriate. No acute bone abnormality. IMPRESSION: No active cardiopulmonary disease. Electronically Signed   By: Helyn Numbers MD   On: 06/14/2020 09:49    Procedures Procedures (including critical care time)  Medications Ordered in ED Medications - No data to display  ED Course  I have reviewed the triage vital signs and the nursing notes.  Pertinent labs & imaging results that were available during my care of the patient were reviewed by me and considered in my medical decision making (see chart for details).    MDM Rules/Calculators/A&P                          Work-up for the 1 week history of constant chest pain without any acute findings. Troponins x2 without any significant change. D-dimer normal. Labs normal. Blood pressure may be some borderline hypertension. EKG without acute changes. Chest x-ray without any acute findings.  Lab patient follow-up with cardiology. Have him start a baby aspirin a day.    Final Clinical Impression(s) / ED Diagnoses Final diagnoses:  Precordial pain    Rx /  DC Orders ED Discharge Orders    None       Vanetta Mulders, MD 06/14/20 1310

## 2020-06-14 NOTE — ED Triage Notes (Signed)
Pt describes nagging chest discomfort with SOB x 1 week.

## 2020-06-14 NOTE — Discharge Instructions (Signed)
Recommend taking a baby aspirin a day. Make an appointment to follow-up with cardiology. Your work-up here today without any acute findings for the chest pain. Call cardiology for follow-up. Return for any new or worse symptoms.

## 2020-08-21 ENCOUNTER — Emergency Department (HOSPITAL_BASED_OUTPATIENT_CLINIC_OR_DEPARTMENT_OTHER)
Admission: EM | Admit: 2020-08-21 | Discharge: 2020-08-21 | Disposition: A | Discharge status: Home / Self Care | Payer: HRSA Program | Attending: Emergency Medicine | Admitting: Emergency Medicine

## 2020-08-21 ENCOUNTER — Emergency Department (HOSPITAL_BASED_OUTPATIENT_CLINIC_OR_DEPARTMENT_OTHER): Payer: HRSA Program

## 2020-08-21 ENCOUNTER — Other Ambulatory Visit: Payer: Self-pay

## 2020-08-21 ENCOUNTER — Other Ambulatory Visit (HOSPITAL_BASED_OUTPATIENT_CLINIC_OR_DEPARTMENT_OTHER): Payer: Self-pay | Admitting: Student

## 2020-08-21 ENCOUNTER — Encounter (HOSPITAL_BASED_OUTPATIENT_CLINIC_OR_DEPARTMENT_OTHER): Payer: Self-pay

## 2020-08-21 DIAGNOSIS — Z87891 Personal history of nicotine dependence: Secondary | ICD-10-CM | POA: Insufficient documentation

## 2020-08-21 DIAGNOSIS — R509 Fever, unspecified: Secondary | ICD-10-CM | POA: Diagnosis present

## 2020-08-21 DIAGNOSIS — U071 COVID-19: Secondary | ICD-10-CM | POA: Insufficient documentation

## 2020-08-21 DIAGNOSIS — J069 Acute upper respiratory infection, unspecified: Secondary | ICD-10-CM | POA: Insufficient documentation

## 2020-08-21 LAB — RAPID INFLUENZA A&B ANTIGENS
Influenza A (ARMC): NEGATIVE
Influenza B (ARMC): NEGATIVE

## 2020-08-21 LAB — SARS CORONAVIRUS 2 (TAT 6-24 HRS): SARS Coronavirus 2: POSITIVE — AB

## 2020-08-21 MED ORDER — BENZONATATE 100 MG PO CAPS: 100.0000 mg | ORAL_CAPSULE | Freq: Three times a day (TID) | ORAL | 0 refills | Status: DC

## 2020-08-21 MED ORDER — FLUTICASONE PROPIONATE 50 MCG/ACT NA SUSP: 1.0000 | Freq: Every day | NASAL | 0 refills | Status: DC | PRN

## 2020-08-21 MED FILL — FLUTICASONE PROP 50 MCG SPR: 50 | 60 days supply | Qty: 16 | Fill #0

## 2020-08-21 MED FILL — BENZONATATE 100 MG CAPS: 100 | 7 days supply | Qty: 21 | Fill #0

## 2020-08-21 NOTE — ED Provider Notes (Signed)
[facility] EMERGENCY DEPARTMENT Provider Note   CSN: [id number] Arrival date & time: 08/21/20  2202    History Chief Complaint  Patient presents with  . Fever  . Cough    Camron Monday is a 54 y.o. male with a hx of hypercholesterolemia and GERD who presents to the ED with complaints of fever that began yesterday.  Patient reports subjective fevers, chills, generalized body aches, nasal congestion, sneezing, throat irritation, and cough intermittently productive of green phlegm sputum.  Does feel a bit short of breath at times.  No alleviating or aggravating factors to his symptoms.  Works with multiple people, states he is unsure if he has had sick contacts with similar symptoms.  He has received 2 doses of the Covid vaccine.  He denies chest pain, hemoptysis, leg pain/swelling, abdominal pain, nausea, vomiting, diarrhea, or syncope.  HPI     Past Medical History:  Diagnosis Date  . GERD (gastroesophageal reflux disease)   . High cholesterol   . Sarcoidosis     There are no problems to display for this patient.   Past Surgical History:  Procedure Laterality Date  . FOOT SURGERY    . HERNIA REPAIR         History reviewed. No pertinent family history.  Social History   Tobacco Use  . Smoking status: Former Games developer  . Smokeless tobacco: Never Used  Vaping Use  . Vaping Use: Never used  Substance Use Topics  . Alcohol use: Yes  . Drug use: No    Home Medications Prior to Admission medications   Medication Sig Start Date End Date Taking? Authorizing Provider  fexofenadine (ALLEGRA) 60 MG tablet Take 1 tablet (60 mg total) by mouth 2 (two) times daily. 04/14/17   Maczis, Elmer Sow, PA-C  losartan (COZAAR) 25 MG tablet Take 25 mg by mouth daily.    [provider]  naproxen (NAPROSYN) 500 MG tablet Take 1 tablet (500 mg total) by mouth 2 (two) times daily as needed for moderate pain. 07/14/19   Lorelee New, PA-C  OMEPRAZOLE PO Take by  mouth.    [provider]  ondansetron (ZOFRAN ODT) 4 MG disintegrating tablet 4mg  ODT q4 hours prn nausea/vomit 03/28/18   05/28/18, MD    Allergies    Amlodipine and Lisinopril  Review of Systems   Review of Systems  Constitutional: Positive for chills and fever.  HENT: Positive for congestion and sore throat. Negative for ear pain.   Respiratory: Positive for cough and shortness of breath.   Cardiovascular: Negative for chest pain.  Gastrointestinal: Negative for abdominal pain, diarrhea, nausea and vomiting.  Neurological: Negative for syncope.  All other systems reviewed and are negative.   Physical Exam Updated Vital Signs BP (!) 155/129 (BP Location: Left Arm) Comment: pt states he is supposed to take BP medication  Pulse 97   Temp 98.4 F (36.9 C) (Oral)   Resp 18   Ht 5\' 11"  (1.803 m)   Wt 108.9 kg   SpO2 97%   BMI 33.47 kg/m   Physical Exam Vitals and nursing note reviewed.  Constitutional:      General: He is not in acute distress.    Appearance: He is well-developed. He is not toxic-appearing.  HENT:     Head: Normocephalic and atraumatic.     Right Ear: Ear canal normal. Tympanic membrane is not perforated, erythematous, retracted or bulging.     Left Ear: Ear canal normal. Tympanic membrane is  not perforated, erythematous, retracted or bulging.     Ears:     Comments: No mastoid erythema/swellng/tenderness.     Nose: Congestion present.     Right Sinus: No maxillary sinus tenderness or frontal sinus tenderness.     Left Sinus: No maxillary sinus tenderness or frontal sinus tenderness.     Mouth/Throat:     Pharynx: Oropharynx is clear. No oropharyngeal exudate or posterior oropharyngeal erythema.     Comments: Posterior oropharynx is symmetric appearing. Patient tolerating own secretions without difficulty. No trismus. No drooling. No hot potato voice. No swelling beneath the tongue, submandibular compartment is soft.  Eyes:     General:         Right eye: No discharge.        Left eye: No discharge.     Conjunctiva/sclera: Conjunctivae normal.  Cardiovascular:     Rate and Rhythm: Normal rate and regular rhythm.  Pulmonary:     Effort: Pulmonary effort is normal. No respiratory distress.     Breath sounds: Normal breath sounds. No wheezing, rhonchi or rales.  Abdominal:     General: There is no distension.     Palpations: Abdomen is soft.     Tenderness: There is no abdominal tenderness.  Musculoskeletal:     Cervical back: Neck supple. No rigidity.  Lymphadenopathy:     Cervical: No cervical adenopathy.  Skin:    General: Skin is warm and dry.     Findings: No rash.  Neurological:     Mental Status: He is alert.  Psychiatric:        Behavior: Behavior normal.     ED Results / Procedures / Treatments   Labs (all labs ordered are listed, but only abnormal results are displayed) Labs Reviewed  RAPID INFLUENZA A&B ANTIGENS  SARS CORONAVIRUS 2 (TAT 6-24 HRS)    EKG None  Radiology DG Chest Portable 1 View  Result Date: 08/21/2020 CLINICAL DATA:  Cough and fever EXAM: PORTABLE CHEST 1 VIEW COMPARISON:  June 14, 2020 FINDINGS: Lungs are clear. Heart is borderline enlarged with pulmonary vascularity normal. No adenopathy. No bone lesions. IMPRESSION: Lungs clear.  Heart borderline enlarged. Electronically Signed   By: Bretta Bang III M.D.   On: 08/21/2020 10:52    Procedures Procedures (including critical care time)  Medications Ordered in ED Medications - No data to display  ED Course  I have reviewed the triage vital signs and the nursing notes.  Pertinent labs & imaging results that were available during my care of the patient were reviewed by me and considered in my medical decision making (see chart for details).    Wasyl Dornfeld was evaluated in Emergency Department on 08/21/2020 for the symptoms described in the history of present illness. He/she was evaluated in the context of the global  COVID-19 pandemic, which necessitated consideration that the patient might be at risk for infection with the SARS-CoV-2 virus that causes COVID-19. Institutional protocols and algorithms that pertain to the evaluation of patients at risk for COVID-19 are in a state of rapid change based on information released by regulatory bodies including the CDC and federal and state organizations. These policies and algorithms were followed during the patient's care in the ED.  MDM Rules/Calculators/A&P                         Patient presents to the ED with fever, chills, body aches, and URI symptoms.  Patient is nontoxic and  resting comfortably, initial blood pressure elevated, normalized on repeat vital signs.  Additional history obtained:  Additional history obtained from chart review & nursing note review.   Lab Tests:  I Ordered, reviewed, and interpreted labs, which included:  Influenza testing: Negative Covid testing: Pending Imaging Studies ordered:  I ordered imaging studies which included chest x-ray, I independently visualized and interpreted imaging which showed  Lungs clear.  Heart borderline enlarged.   ED Course:  Exam is without signs of AOM, AOE, or mastoiditis. Oropharyngeal exam is benign. No sinus tenderness. No meningeal signs. Lungs are CTA without focal adventitious sounds, no signs of increased work of breathing, CXR without infiltrate  doubt CAP.  I personally ambulated the patient throughout the exam room with SPO2 maintaining 95 to 97% on room air without signs of respiratory distress.. Overall suspect viral.  COVID-19 testing is pending. Recommended supportive care. I discussed results, treatment plan, need for follow-up, and return precautions with the patient Provided opportunity for questions, patient confirmed understanding and is in agreement with plan.    Portions of this note were generated with Scientist, clinical (histocompatibility and immunogenetics). Dictation errors may occur despite best attempts at  proofreading.   Final Clinical Impression(s) / ED Diagnoses Final diagnoses:  Viral URI with cough    Rx / DC Orders ED Discharge Orders         Ordered    fluticasone (FLONASE) 50 MCG/ACT nasal spray  Daily PRN        08/21/20 1307    benzonatate (TESSALON) 100 MG capsule  Every 8 hours        08/21/20 1307           Deone Leifheit, G. L. Garci­a R, PA-C 08/21/20 1308    Horton, Clabe Seal, DO 08/21/20 2042

## 2020-08-21 NOTE — ED Triage Notes (Signed)
Pt reports cough, fever and chills since yesterday. Pt took tylenol last night and early this morning for symptoms.

## 2020-08-21 NOTE — Discharge Instructions (Addendum)
We have tested you for COVID 19, we will call you within the next 48 hours if results are positive, you may also view these results on MyChart.   We are instructing patient's with COVID 19 or symptoms of COVID 19 to isolate themselves for 14 days. You may be able to discontinue self isolation if the following conditions are met:   Persons with COVID-19 who have symptoms and were directed to care for themselves at home may discontinue home isolation under the  following conditions: - It has been at least 10 days have passed since symptoms first appeared. - AND at least 3 days (72 hours) have passed since recovery defined as resolution of fever without the use of fever-reducing medications and improvement in respiratory symptoms (e.g., cough, shortness of breath)  Please follow the below instructions.  We are sending home with Flonase use 1 spray per nostril daily as needed for congestion and Tessalon to take every 8 hours as needed for coughing.  Please take Motrin and/or Tylenol per over-the-counter dosing to help with any discomfort or fevers.  We have prescribed you new medication(s) today. Discuss the medications prescribed today with your pharmacist as they can have adverse effects and interactions with your other medicines including over the counter and prescribed medications. Seek medical evaluation if you start to experience new or abnormal symptoms after taking one of these medicines, seek care immediately if you start to experience difficulty breathing, feeling of your throat closing, facial swelling, or rash as these could be indications of a more serious allergic reaction   Please follow up with primary care within 3-5 days for re-evaluation- call prior to going to the office to make them aware of your symptoms as some offices are altering their method of seeing patients with COVID 19 symptoms, we have also provided our [place] covid clinic for follow up as well.  Return to the ER for new or  worsening symptoms including but not limited to increased work of breathing, chest pain, passing out, or any other concerns.       Person Under Monitoring Name: Clifford Daniels  Location: [address] [place] [address]   Infection Prevention Recommendations for Individuals Confirmed to have, or Being Evaluated for, 2019 Novel Coronavirus (COVID-19) Infection Who Receive Care at Home  Individuals who are confirmed to have, or are being evaluated for, COVID-19 should follow the prevention steps below until a healthcare provider or local or state health department says they can return to normal activities.  Stay home except to get medical care You should restrict activities outside your home, except for getting medical care. Do not go to work, school, or public areas, and do not use public transportation or taxis.  Call ahead before visiting your doctor Before your medical appointment, call the healthcare provider and tell them that you have, or are being evaluated for, COVID-19 infection. This will help the healthcare provider's office take steps to keep other people from getting infected. Ask your healthcare provider to call the local or state health department.  Monitor your symptoms Seek prompt medical attention if your illness is worsening (e.g., difficulty breathing). Before going to your medical appointment, call the healthcare provider and tell them that you have, or are being evaluated for, COVID-19 infection. Ask your healthcare provider to call the local or state health department.  Wear a facemask You should wear a facemask that covers your nose and mouth when you are in the same room with other people  and when you visit a healthcare provider. People who live with or visit you should also wear a facemask while they are in the same room with you.  Separate yourself from other people in your home As much as possible, you should stay in a different room from  other people in your home. Also, you should use a separate bathroom, if available.  Avoid sharing household items You should not share dishes, drinking glasses, cups, eating utensils, towels, bedding, or other items with other people in your home. After using these items, you should wash them thoroughly with soap and water.  Cover your coughs and sneezes Cover your mouth and nose with a tissue when you cough or sneeze, or you can cough or sneeze into your sleeve. Throw used tissues in a lined trash can, and immediately wash your hands with soap and water for at least 20 seconds or use an alcohol-based hand rub.  Wash your [facility] your hands often and thoroughly with soap and water for at least 20 seconds. You can use an alcohol-based hand sanitizer if soap and water are not available and if your hands are not visibly dirty. Avoid touching your eyes, nose, and mouth with unwashed hands.   Prevention Steps for Caregivers and Household Members of Individuals Confirmed to have, or Being Evaluated for, COVID-19 Infection Being Cared for in the Home  If you live with, or provide care at home for, a person confirmed to have, or being evaluated for, COVID-19 infection please follow these guidelines to prevent infection:  Follow healthcare provider's instructions Make sure that you understand and can help the patient follow any healthcare provider instructions for all care.  Provide for the patient's basic needs You should help the patient with basic needs in the home and provide support for getting groceries, prescriptions, and other personal needs.  Monitor the patient's symptoms If they are getting sicker, call his or her medical provider and tell them that the patient has, or is being evaluated for, COVID-19 infection. This will help the healthcare provider's office take steps to keep other people from getting infected. Ask the healthcare provider to call the local or state health  department.  Limit the number of people who have contact with the patient If possible, have only one caregiver for the patient. Other household members should stay in another home or place of residence. If this is not possible, they should stay in another room, or be separated from the patient as much as possible. Use a separate bathroom, if available. Restrict visitors who do not have an essential need to be in the home.  Keep older adults, very young children, and other sick people away from the patient Keep older adults, very young children, and those who have compromised immune systems or chronic health conditions away from the patient. This includes people with chronic heart, lung, or kidney conditions, diabetes, and cancer.  Ensure good ventilation Make sure that shared spaces in the home have good air flow, such as from an air conditioner or an opened window, weather permitting.  Wash your hands often Wash your hands often and thoroughly with soap and water for at least 20 seconds. You can use an alcohol based hand sanitizer if soap and water are not available and if your hands are not visibly dirty. Avoid touching your eyes, nose, and mouth with unwashed hands. Use disposable paper towels to dry your hands. If not available, use dedicated cloth towels and replace them when they  become wet.  Wear a facemask and gloves Wear a disposable facemask at all times in the room and gloves when you touch or have contact with the patient's blood, body fluids, and/or secretions or excretions, such as sweat, saliva, sputum, nasal mucus, vomit, urine, or feces.  Ensure the mask fits over your nose and mouth tightly, and do not touch it during use. Throw out disposable facemasks and gloves after using them. Do not reuse. Wash your hands immediately after removing your facemask and gloves. If your personal clothing becomes contaminated, carefully remove clothing and launder. Wash your hands after  handling contaminated clothing. Place all used disposable facemasks, gloves, and other waste in a lined container before disposing them with other household waste. Remove gloves and wash your hands immediately after handling these items.  Do not share dishes, glasses, or other household items with the patient Avoid sharing household items. You should not share dishes, drinking glasses, cups, eating utensils, towels, bedding, or other items with a patient who is confirmed to have, or being evaluated for, COVID-19 infection. After the person uses these items, you should wash them thoroughly with soap and water.  Wash laundry thoroughly Immediately remove and wash clothes or bedding that have blood, body fluids, and/or secretions or excretions, such as sweat, saliva, sputum, nasal mucus, vomit, urine, or feces, on them. Wear gloves when handling laundry from the patient. Read and follow directions on labels of laundry or clothing items and detergent. In general, wash and dry with the warmest temperatures recommended on the label.  Clean all areas the individual has used often Clean all touchable surfaces, such as counters, tabletops, doorknobs, bathroom fixtures, toilets, phones, keyboards, tablets, and bedside tables, every day. Also, clean any surfaces that may have blood, body fluids, and/or secretions or excretions on them. Wear gloves when cleaning surfaces the patient has come in contact with. Use a diluted bleach solution (e.g., dilute bleach with 1 part bleach and 10 parts water) or a household disinfectant with a label that says EPA-registered for coronaviruses. To make a bleach solution at home, add 1 tablespoon of bleach to 1 quart (4 cups) of water. For a larger supply, add  cup of bleach to 1 gallon (16 cups) of water. Read labels of cleaning products and follow recommendations provided on product labels. Labels contain instructions for safe and effective use of the cleaning product  including precautions you should take when applying the product, such as wearing gloves or eye protection and making sure you have good ventilation during use of the product. Remove gloves and wash hands immediately after cleaning.  Monitor yourself for signs and symptoms of illness Caregivers and household members are considered close contacts, should monitor their health, and will be asked to limit movement outside of the home to the extent possible. Follow the monitoring steps for close contacts listed on the symptom monitoring form.   ? If you have additional questions, contact your local health department or call the epidemiologist on call at [phone] (available 24/7). ? This guidance is subject to change. For the most up-to-date guidance from [facility], please refer to their website: YouBlogs.pl

## 2022-06-02 ENCOUNTER — Other Ambulatory Visit: Payer: Self-pay

## 2022-06-02 ENCOUNTER — Encounter (HOSPITAL_BASED_OUTPATIENT_CLINIC_OR_DEPARTMENT_OTHER): Payer: Self-pay | Admitting: Pediatrics

## 2022-06-02 ENCOUNTER — Emergency Department (HOSPITAL_BASED_OUTPATIENT_CLINIC_OR_DEPARTMENT_OTHER): Payer: BC Managed Care – PPO

## 2022-06-02 ENCOUNTER — Emergency Department (HOSPITAL_BASED_OUTPATIENT_CLINIC_OR_DEPARTMENT_OTHER)
Admission: EM | Admit: 2022-06-02 | Discharge: 2022-06-02 | Disposition: A | Discharge status: Home / Self Care | Payer: BC Managed Care – PPO | Attending: Emergency Medicine | Admitting: Emergency Medicine

## 2022-06-02 DIAGNOSIS — Y990 Civilian activity done for income or pay: Secondary | ICD-10-CM | POA: Insufficient documentation

## 2022-06-02 DIAGNOSIS — M25512 Pain in left shoulder: Secondary | ICD-10-CM | POA: Diagnosis present

## 2022-06-02 DIAGNOSIS — X500XXA Overexertion from strenuous movement or load, initial encounter: Secondary | ICD-10-CM | POA: Diagnosis not present

## 2022-06-02 MED ORDER — PREDNISONE 20 MG PO TABS: 40.0000 mg | ORAL_TABLET | Freq: Every day | ORAL | 0 refills | Status: AC

## 2022-06-02 MED ORDER — OXYCODONE-ACETAMINOPHEN 5-325 MG PO TABS: 1.0000 | ORAL_TABLET | Freq: Four times a day (QID) | ORAL | 0 refills | Status: AC | PRN

## 2022-06-02 NOTE — ED Notes (Signed)
Patient transported to X-ray 

## 2022-06-02 NOTE — Discharge Instructions (Addendum)
You are seen in the emergency department for left shoulder pain.  You had an EKG and chest x-ray that did not show any significant findings.  Please continue anti-inflammatories such as ibuprofen or Aleve.  We are prescribing you some pain medication and a short course of some steroids.  Contact Dr. Raeford Razor sports medicine for further evaluation.  Return if any worsening or concerning symptoms

## 2022-06-02 NOTE — ED Triage Notes (Signed)
C/O left shoulder pain along with some swelling; started about a week ago and worsening; brief relief from ice pack, stated had some icy hot, Toradol and no relief. Pain is reproducible with palpation and discomfort with some movements.

## 2022-06-02 NOTE — ED Provider Notes (Signed)
[facility] EMERGENCY DEPARTMENT Provider Note   CSN: [id number] Arrival date & time: 06/02/22  1105     History  Chief Complaint  Patient presents with   Shoulder Pain    Clifford Daniels is a 56 y.o. male.  He has no significant past medical history.  He is right-hand dominant complaining of left shoulder and scapular pain has been going on about a week.  He does heavy lifting at work.  No clear trauma though.  He has been trying ibuprofen Aleve Tylenol with minimal improvement.  The history is provided by the patient.  Shoulder Pain Location:  Shoulder Shoulder location:  L shoulder Injury: no   Pain details:    Quality:  Aching   Severity:  Severe   Onset quality:  Gradual   Duration:  1 week   Timing:  Constant   Progression:  Worsening Dislocation: no   Relieved by:  Nothing Worsened by:  Exercise and movement Ineffective treatments:  Acetaminophen and NSAIDs Associated symptoms: swelling   Associated symptoms: no back pain, no decreased range of motion, no fever, no muscle weakness and no numbness        Home Medications Prior to Admission medications   Medication Sig Start Date End Date Taking? Authorizing Provider  fexofenadine (ALLEGRA) 60 MG tablet Take 1 tablet (60 mg total) by mouth 2 (two) times daily. 04/14/17   Maczis, Barth Kirks, PA-C  fluticasone (FLONASE) 50 MCG/ACT nasal spray PLACE 1 SPRAY INTO BOTH NOSTRILS DAILY AS NEEDED FOR ALLERGIES OR RHINITIS (CONGESTION) 08/21/20 08/21/21  Petrucelli, Aldona Bar R, PA-C  losartan (COZAAR) 25 MG tablet Take 25 mg by mouth daily.    [provider]  naproxen (NAPROSYN) 500 MG tablet Take 1 tablet (500 mg total) by mouth 2 (two) times daily as needed for moderate pain. 07/14/19   Corena Herter, PA-C  OMEPRAZOLE PO Take by mouth.    [provider]  ondansetron (ZOFRAN ODT) 4 MG disintegrating tablet 4mg  ODT q4 hours prn nausea/vomit 03/28/18   Malvin Johns, MD      Allergies     Amlodipine and Lisinopril    Review of Systems   Review of Systems  Constitutional:  Negative for fever.  Musculoskeletal:  Negative for back pain.    Physical Exam Updated Vital Signs BP (!) 148/97 (BP Location: Right Arm)   Pulse 77   Temp 98.6 F (37 C) (Oral)   Resp 18   Ht 5\' 11"  (1.803 m)   Wt 116.5 kg   SpO2 98%   BMI 35.82 kg/m  Physical Exam Vitals and nursing note reviewed.  Constitutional:      Appearance: Normal appearance. He is well-developed.  HENT:     Head: Normocephalic and atraumatic.  Eyes:     Conjunctiva/sclera: Conjunctivae normal.  Cardiovascular:     Rate and Rhythm: Normal rate and regular rhythm.  Pulmonary:     Effort: Pulmonary effort is normal.  Musculoskeletal:        General: Tenderness present. No deformity. Normal range of motion.     Cervical back: Neck supple.     Comments: Patient's left shoulder minimally tender.  He is primarily tender through his left trapezius and rhomboid area through his left scapula.  There is no open wounds or significant swelling no overlying skin changes.  No rashes.  Shoulder normal internal/external rotation.  He does have pain with active motion.  Skin:    General: Skin is warm and dry.  Neurological:  General: No focal deficit present.     Mental Status: He is alert.     GCS: GCS eye subscore is 4. GCS verbal subscore is 5. GCS motor subscore is 6.     Sensory: No sensory deficit.     Motor: No weakness.     ED Results / Procedures / Treatments   Labs (all labs ordered are listed, but only abnormal results are displayed) Labs Reviewed - No data to display  EKG EKG Interpretation  Date/Time:  Monday June 02 2022 11:25:38 EDT Ventricular Rate:  83 PR Interval:  174 QRS Duration: 88 QT Interval:  350 QTC Calculation: 411 R Axis:   64 Text Interpretation: Normal sinus rhythm Anterior infarct , age undetermined Abnormal ECG When compared with ECG of 14-Jun-2020 09:38, poor r wave  progression from prior Confirmed by Meridee Score [phone]) on 06/02/2022 11:36:19 AM  Radiology DG Shoulder Left  Result Date: 06/02/2022 CLINICAL DATA:  Left shoulder pain and swelling EXAM: LEFT SHOULDER - 2+ VIEW COMPARISON:  Chest radiographs 08/21/2020 FINDINGS: No evidence of acute fracture or dislocation. Prominent bony outgrowth about the undersurface of the distal clavicle is indeterminate and may be due to remote trauma or an osteochondroma. This does not appear significantly changed since radiographs 10/16/2010. 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 IMPRESSION: No acute osseous abnormality. Question posttraumatic change versus benign osteochondroma about the inferior surface of the distal left clavicle. If there is concern for impingement, consider MRI for further evaluation. Electronically Signed   By: Minerva Fester M.D.   On: 06/02/2022 11:40    Procedures Procedures    Medications Ordered in ED Medications - No data to display  ED Course/ Medical Decision Making/ A&P                           Medical Decision Making Amount and/or Complexity of Data Reviewed Radiology: ordered.  Risk Prescription drug management.  This patient complains of left shoulder scapular pain; this involves an extensive number of treatment Options and is a complaint that carries with it a high risk of complications and morbidity. The differential includes musculoskeletal pain, fracture, dislocation, septic joint, radiculopathy I ordered imaging studies which included left shoulder x-ray and I independently    visualized and interpreted imaging which showed no acute fracture or dislocation.  Radiology comments upon some distal clavicle findings.  Patient was nontender there. Previous records obtained and reviewed in epic no recent admissions Social determinants considered, no significant barriers Critical Interventions: None  After the interventions stated above, I reevaluated  the patient and found patient is well-appearing with reproducible posterior shoulder pain Admission and further testing considered, no indications for admission or further testing at this time.  He is already on NSAIDs, will add prednisone and pain medication.  Given contact information for sports medicine follow-up.  Return instructions discussed          Final Clinical Impression(s) / ED Diagnoses Final diagnoses:  Acute pain of left shoulder    Rx / DC Orders ED Discharge Orders          Ordered    oxyCODONE-acetaminophen (PERCOCET/ROXICET) 5-325 MG tablet  Every 6 hours PRN        06/02/22 1210    predniSONE (DELTASONE) 20 MG tablet  Daily        06/02/22 1210              Terrilee Files, MD 06/02/22 1731

## 2022-06-06 ENCOUNTER — Other Ambulatory Visit (HOSPITAL_BASED_OUTPATIENT_CLINIC_OR_DEPARTMENT_OTHER): Payer: Self-pay

## 2022-06-06 ENCOUNTER — Ambulatory Visit (INDEPENDENT_AMBULATORY_CARE_PROVIDER_SITE_OTHER): Payer: BC Managed Care – PPO | Admitting: Family Medicine

## 2022-06-06 ENCOUNTER — Encounter: Payer: Self-pay | Admitting: Family Medicine

## 2022-06-06 ENCOUNTER — Ambulatory Visit (HOSPITAL_BASED_OUTPATIENT_CLINIC_OR_DEPARTMENT_OTHER)
Admission: RE | Admit: 2022-06-06 | Discharge: 2022-06-06 | Disposition: A | Discharge status: Home / Self Care | Payer: BC Managed Care – PPO | Source: Ambulatory Visit | Attending: Family Medicine | Admitting: Family Medicine

## 2022-06-06 VITALS — BP 152/100 | Ht 71.0 in | Wt 256.0 lb

## 2022-06-06 DIAGNOSIS — M5412 Radiculopathy, cervical region: Secondary | ICD-10-CM | POA: Insufficient documentation

## 2022-06-06 MED ORDER — METHYLPREDNISOLONE ACETATE 40 MG/ML IJ SUSP
40.0000 mg | Freq: Once | INTRAMUSCULAR | Status: AC
  Administered 2022-06-06: 40 mg via INTRAMUSCULAR

## 2022-06-06 MED ORDER — KETOROLAC TROMETHAMINE 30 MG/ML IJ SOLN
30.0000 mg | Freq: Once | INTRAMUSCULAR | Status: AC
  Administered 2022-06-06: 30 mg via INTRAMUSCULAR

## 2022-06-06 MED ORDER — GABAPENTIN 300 MG PO CAPS
300.0000 mg | ORAL_CAPSULE | Freq: Three times a day (TID) | ORAL | 1 refills | Status: AC
  Filled 2022-06-06: qty 90, 30d supply, fill #0

## 2022-06-06 NOTE — Progress Notes (Signed)
  Clifford Daniels - 55 y.o. male MRN [id number]  Date of birth: Jul 21, 1966  SUBJECTIVE:  Including CC & ROS.  No chief complaint on file.   Clifford Daniels is a 56 y.o. male that is presenting with radicular type pain down the medial border of the left shoulder blade.  Pain has been ongoing for 3 weeks.  Severe in nature.  Worse at night.  Pain is acute on chronic in nature.  Has dealt with this at least once a year for several years.  No history of surgery.  Review of the emergency department note from 10/9 shows he was provided Percocet and prednisone. Independent review left shoulder x-ray from 10/9 shows no acute changes.  Review of Systems See HPI   HISTORY: Past Medical, Surgical, Social, and Family History Reviewed & Updated per EMR.   Pertinent Historical Findings include:  Past Medical History:  Diagnosis Date   GERD (gastroesophageal reflux disease)    High cholesterol    Sarcoidosis     Past Surgical History:  Procedure Laterality Date   FOOT SURGERY     HERNIA REPAIR       PHYSICAL EXAM:  VS: BP (!) 152/100 (BP Location: Left Arm, Patient Position: Sitting)   Ht 5\' 11"  (1.803 m)   Wt 256 lb (116.1 kg)   BMI 35.70 kg/m  Physical Exam Gen: NAD, alert, cooperative with exam, well-appearing MSK:  Neurovascularly intact       ASSESSMENT & PLAN:   Cervical radiculopathy Acute on chronic in nature.  Symptoms most consistent with a radicular pattern in a nerve impingement origin.  Has dealt with this intermittently over several years. -Counseled on home exercise therapy and supportive care. -X-rays today. -IM Depo Medrol and Toradol. -Gabapentin -Could consider further imaging.

## 2022-06-06 NOTE — Patient Instructions (Signed)
Nice to meet you Please try heat  Please try the exercises  Please start with gabapentin at night and you can increase to 2 or 3 times daily as you tolerate  Please use the percocet as needed   Please send me a message in MyChart with any questions or updates.  Please see me back in 2-3 weeks.   --Dr. Raeford Razor

## 2022-06-06 NOTE — Assessment & Plan Note (Signed)
Acute on chronic in nature.  Symptoms most consistent with a radicular pattern in a nerve impingement origin.  Has dealt with this intermittently over several years. -Counseled on home exercise therapy and supportive care. -X-rays today. -IM Depo Medrol and Toradol. -Gabapentin -Could consider further imaging.

## 2022-06-11 ENCOUNTER — Telehealth: Payer: Self-pay | Admitting: Family Medicine

## 2022-06-11 NOTE — Telephone Encounter (Signed)
Informed of results.   Rosemarie Ax, MD [facility] 06/11/2022, 2:39 PM

## 2022-06-17 ENCOUNTER — Ambulatory Visit (INDEPENDENT_AMBULATORY_CARE_PROVIDER_SITE_OTHER): Payer: BC Managed Care – PPO | Admitting: Family Medicine

## 2022-06-17 ENCOUNTER — Encounter: Payer: Self-pay | Admitting: Family Medicine

## 2022-06-17 ENCOUNTER — Other Ambulatory Visit (HOSPITAL_BASED_OUTPATIENT_CLINIC_OR_DEPARTMENT_OTHER): Payer: Self-pay

## 2022-06-17 VITALS — BP 142/84 | Ht 71.0 in | Wt 256.0 lb

## 2022-06-17 DIAGNOSIS — M5412 Radiculopathy, cervical region: Secondary | ICD-10-CM | POA: Diagnosis not present

## 2022-06-17 MED ORDER — CYCLOBENZAPRINE HCL 10 MG PO TABS
10.0000 mg | ORAL_TABLET | Freq: Three times a day (TID) | ORAL | 1 refills | Status: AC | PRN
  Filled 2022-06-17: qty 45, 15d supply, fill #0

## 2022-06-17 NOTE — Assessment & Plan Note (Signed)
Acutely occurring.  Symptoms still occurring on the left side.  Swelling has improved but he is still having pain at night. -Counseled on home exercise therapy and supportive care. -Flexeril. -Could consider adding another gabapentin at night. -Referral to physical therapy. -Could consider further imaging.

## 2022-06-17 NOTE — Progress Notes (Signed)
  Clifford Daniels - 56 y.o. male MRN [id number]  Date of birth: [birth year]  SUBJECTIVE:  Including CC & ROS.  No chief complaint on file.   Clifford Daniels is a 56 y.o. male that is following up for his left-sided radicular pain.  The swelling is improved but he still endorses pain at night.  He has been having trouble sleeping.    Review of Systems See HPI   HISTORY: Past Medical, Surgical, Social, and Family History Reviewed & Updated per EMR.   Pertinent Historical Findings include:  Past Medical History:  Diagnosis Date   GERD (gastroesophageal reflux disease)    High cholesterol    Sarcoidosis     Past Surgical History:  Procedure Laterality Date   FOOT SURGERY     HERNIA REPAIR       PHYSICAL EXAM:  VS: BP (!) 142/84 (BP Location: Left Arm, Patient Position: Sitting)   Ht 5\' 11"  (1.803 m)   Wt 256 lb (116.1 kg)   BMI 35.70 kg/m  Physical Exam Gen: NAD, alert, cooperative with exam, well-appearing MSK:  Neurovascularly intact       ASSESSMENT & PLAN:   Cervical radiculopathy Acutely occurring.  Symptoms still occurring on the left side.  Swelling has improved but he is still having pain at night. -Counseled on home exercise therapy and supportive care. -Flexeril. -Could consider adding another gabapentin at night. -Referral to physical therapy. -Could consider further imaging.

## 2022-06-17 NOTE — Patient Instructions (Signed)
Good to see you Please continue heat  Please try the muscle relaxer  I have made a referral to physical therapy   Please send me a message in MyChart with any questions or updates.  Please see me back in 4 weeks.   --Dr. Raeford Razor

## 2022-07-15 ENCOUNTER — Ambulatory Visit: Payer: BC Managed Care – PPO | Admitting: Family Medicine

## 2022-12-08 ENCOUNTER — Encounter: Payer: Self-pay | Admitting: *Deleted

## 2023-01-28 ENCOUNTER — Encounter (HOSPITAL_BASED_OUTPATIENT_CLINIC_OR_DEPARTMENT_OTHER): Payer: Self-pay | Admitting: Emergency Medicine

## 2023-01-28 ENCOUNTER — Emergency Department (HOSPITAL_BASED_OUTPATIENT_CLINIC_OR_DEPARTMENT_OTHER): Payer: BC Managed Care – PPO

## 2023-01-28 ENCOUNTER — Emergency Department (HOSPITAL_BASED_OUTPATIENT_CLINIC_OR_DEPARTMENT_OTHER)
Admission: EM | Admit: 2023-01-28 | Discharge: 2023-01-28 | Disposition: A | Discharge status: Home / Self Care | Payer: BC Managed Care – PPO | Attending: Emergency Medicine | Admitting: Emergency Medicine

## 2023-01-28 ENCOUNTER — Other Ambulatory Visit: Payer: Self-pay

## 2023-01-28 ENCOUNTER — Other Ambulatory Visit (HOSPITAL_BASED_OUTPATIENT_CLINIC_OR_DEPARTMENT_OTHER): Payer: Self-pay

## 2023-01-28 DIAGNOSIS — U071 COVID-19: Secondary | ICD-10-CM | POA: Insufficient documentation

## 2023-01-28 DIAGNOSIS — R059 Cough, unspecified: Secondary | ICD-10-CM | POA: Diagnosis present

## 2023-01-28 MED ORDER — OXYCODONE HCL 5 MG PO TABS
5.0000 mg | ORAL_TABLET | Freq: Once | ORAL | Status: AC
  Administered 2023-01-28: 5 mg via ORAL
  Filled 2023-01-28: qty 1

## 2023-01-28 MED ORDER — KETOROLAC TROMETHAMINE 15 MG/ML IJ SOLN
15.0000 mg | Freq: Once | INTRAMUSCULAR | Status: AC
  Administered 2023-01-28: 15 mg via INTRAMUSCULAR
  Filled 2023-01-28: qty 1

## 2023-01-28 MED ORDER — BENZONATATE 100 MG PO CAPS
100.0000 mg | ORAL_CAPSULE | Freq: Three times a day (TID) | ORAL | 0 refills | Status: AC
  Filled 2023-01-28: qty 21, 7d supply, fill #0

## 2023-01-28 MED ORDER — OXYCODONE HCL 5 MG PO TABS
5.0000 mg | ORAL_TABLET | Freq: Once | ORAL | Status: DC
  Filled 2023-01-28: qty 1

## 2023-01-28 MED ORDER — ACETAMINOPHEN 500 MG PO TABS
1000.0000 mg | ORAL_TABLET | Freq: Once | ORAL | Status: AC
  Administered 2023-01-28: 1000 mg via ORAL
  Filled 2023-01-28: qty 2

## 2023-01-28 MED ORDER — ONDANSETRON 4 MG PO TBDP
4.0000 mg | ORAL_TABLET | ORAL | 0 refills | Status: AC | PRN
  Filled 2023-01-28: qty 20, 30d supply, fill #0

## 2023-01-28 MED ORDER — ACETAMINOPHEN 500 MG PO TABS
ORAL_TABLET | ORAL | Status: AC
  Filled 2023-01-28: qty 2

## 2023-01-28 MED ORDER — MORPHINE SULFATE 15 MG PO TABS
7.5000 mg | ORAL_TABLET | ORAL | 0 refills | Status: AC | PRN
  Filled 2023-01-28: qty 7, 3d supply, fill #0

## 2023-01-28 NOTE — Discharge Instructions (Addendum)
Your chest x-ray did not show an obvious pneumonia broken rib or collapsed lung.  Please use the incentive spirometer 10 minutes out of every hour that you are awake.  Take tylenol 2 pills 4 times a day and motrin 4 pills 3 times a day.  Drink plenty of fluids.  Return for worsening shortness of breath, headache, confusion. Follow up with your family doctor.   Then take the pain medicine if you feel like you need it. Narcotics do not help with the pain, they only make you care about it less.  You can become addicted to this, people may break into your house to steal it.  It will constipate you.  If you drive under the influence of this medicine you can get a DUI.

## 2023-01-28 NOTE — ED Triage Notes (Signed)
Pt dx with Covid yesterday; cough since Monday; c/o LT rib pain with cough

## 2023-01-28 NOTE — ED Provider Notes (Signed)
Clifford Daniels Provider Note   CSN: [id number] Arrival date & time: 01/28/23  1150     History  Chief Complaint  Patient presents with   Cough    Clifford Daniels is a 57 y.o. male.  57 yo M with a chief complaint of cough fevers chills fatigue.  This been going on for about 4 days now.  He did a home COVID test and it was positive.  No known sick contacts no recent travel.  Started having some severe left-sided chest pain that is worse with coughing and deep breathing.   Cough      Home Medications Prior to Admission medications   Medication Sig Start Date End Date Taking? Authorizing Provider  benzonatate (TESSALON) 100 MG capsule Take 1 capsule (100 mg total) by mouth every 8 (eight) hours. 01/28/23  Yes Melene Plan, DO  morphine (MSIR) 15 MG tablet Take 0.5 tablets (7.5 mg total) by mouth every 4 (four) hours as needed for severe pain. 01/28/23  Yes Melene Plan, DO  ondansetron (ZOFRAN-ODT) 4 MG disintegrating tablet 4mg  ODT q4 hours prn nausea/vomit 01/28/23  Yes Melene Plan, DO  cyclobenzaprine (FLEXERIL) 10 MG tablet Take 1 tablet (10 mg total) by mouth 3 (three) times daily as needed. 06/17/22   Myra Rude, MD  fexofenadine (ALLEGRA) 60 MG tablet Take 1 tablet (60 mg total) by mouth 2 (two) times daily. 04/14/17   Maczis, Elmer Sow, PA-C  fluticasone (FLONASE) 50 MCG/ACT nasal spray PLACE 1 SPRAY INTO BOTH NOSTRILS DAILY AS NEEDED FOR ALLERGIES OR RHINITIS (CONGESTION) 08/21/20 08/21/21  Petrucelli, Lelon Mast R, PA-C  gabapentin (NEURONTIN) 300 MG capsule Take 1 capsule (300 mg total) by mouth 3 (three) times daily. 06/06/22   Myra Rude, MD  losartan (COZAAR) 25 MG tablet Take 25 mg by mouth daily.    [provider]  naproxen (NAPROSYN) 500 MG tablet Take 1 tablet (500 mg total) by mouth 2 (two) times daily as needed for moderate pain. 07/14/19   Lorelee New, PA-C  OMEPRAZOLE PO Take by mouth.    [provider]  oxyCODONE-acetaminophen (PERCOCET/ROXICET) 5-325 MG tablet Take 1 tablet by mouth every 6 (six) hours as needed for severe pain. 06/02/22   Terrilee Files, MD  predniSONE (DELTASONE) 20 MG tablet Take 2 tablets (40 mg total) by mouth daily. 06/02/22   Terrilee Files, MD      Allergies    Amlodipine and Lisinopril    Review of Systems   Review of Systems  Respiratory:  Positive for cough.     Physical Exam Updated Vital Signs BP (!) 144/93   Pulse (!) 107   Temp 98.9 F (37.2 C)   Resp 20   Ht 5\' 11"  (1.803 m)   Wt 120.2 kg   SpO2 98%   BMI 36.96 kg/m  Physical Exam Vitals and nursing note reviewed.  Constitutional:      Appearance: He is well-developed.  HENT:     Head: Normocephalic and atraumatic.     Comments: Swollen turbinates, posterior nasal drip,  tm normal bilaterally.   Eyes:     Pupils: Pupils are equal, round, and reactive to light.  Neck:     Vascular: No JVD.  Cardiovascular:     Rate and Rhythm: Normal rate and regular rhythm.     Heart sounds: No murmur heard.    No friction rub. No gallop.  Pulmonary:     Effort:  No respiratory distress.     Breath sounds: No wheezing.  Abdominal:     General: There is no distension.     Tenderness: There is no abdominal tenderness. There is no guarding or rebound.  Musculoskeletal:        General: Tenderness present. Normal range of motion.     Cervical back: Normal range of motion and neck supple.     Comments: Pinpoint area of chest pain along the left costal margin about the midclavicular line.  Skin:    Coloration: Skin is not pale.     Findings: No rash.  Neurological:     Mental Status: He is alert and oriented to person, place, and time.  Psychiatric:        Behavior: Behavior normal.     ED Results / Procedures / Treatments   Labs (all labs ordered are listed, but only abnormal results are displayed) Labs Reviewed - No data to display  EKG None  Radiology DG Chest [facility]  1 View  Result Date: 01/28/2023 CLINICAL DATA:  Cough, chest pain EXAM: PORTABLE CHEST 1 VIEW COMPARISON:  08/21/2020 FINDINGS: Transverse diameter of heart is increased. There are no signs of pulmonary edema or focal pulmonary consolidation. There is no pleural effusion or pneumothorax. IMPRESSION: Cardiomegaly. There are no signs of pulmonary edema or focal pulmonary consolidation. Electronically Signed   By: Ernie Avena M.D.   On: 01/28/2023 12:28    Procedures Procedures    Medications Ordered in ED Medications  acetaminophen (TYLENOL) tablet 1,000 mg (has no administration in time range)  oxyCODONE (Oxy IR/ROXICODONE) immediate release tablet 5 mg (has no administration in time range)  ketorolac (TORADOL) 15 MG/ML injection 15 mg (has no administration in time range)    ED Course/ Medical Decision Making/ A&P                             Medical Decision Making Amount and/or Complexity of Data Reviewed Radiology: ordered.  Risk OTC drugs. Prescription drug management.   57 yo M with a chief complaints of cough congestion fatigue left-sided chest pain.  Patient symptoms are consistent with COVID and he had a home COVID test that was positive.  With his severe chest pain and obtained an x-ray to evaluate for pneumothorax this was negative on my independent interpretation.  Will treat as musculoskeletal chest pain after coughing.  Have him follow-up with his family doctor in the office.  Risks and benefits of Paxlovid discussed currently declining.  12:38 PM:  I have discussed the diagnosis/risks/treatment options with the patient and family.  Evaluation and diagnostic testing in the emergency department does not suggest an emergent condition requiring admission or immediate intervention beyond what has been performed at this time.  They will follow up with PCP. We also discussed returning to the ED immediately if new or worsening sx occur. We discussed the sx which are most  concerning (e.g., sudden worsening pain, fever, inability to tolerate by mouth) that necessitate immediate return. Medications administered to the patient during their visit and any new prescriptions provided to the patient are listed below.  Medications given during this visit Medications  acetaminophen (TYLENOL) tablet 1,000 mg (has no administration in time range)  oxyCODONE (Oxy IR/ROXICODONE) immediate release tablet 5 mg (has no administration in time range)  ketorolac (TORADOL) 15 MG/ML injection 15 mg (has no administration in time range)     The patient appears reasonably screen  and/or stabilized for discharge and I doubt any other medical condition or other [facility] requiring further screening, evaluation, or treatment in the ED at this time prior to discharge.          Final Clinical Impression(s) / ED Diagnoses Final diagnoses:  COVID-19 virus infection    Rx / DC Orders ED Discharge Orders          Ordered    benzonatate (TESSALON) 100 MG capsule  Every 8 hours        01/28/23 1234    ondansetron (ZOFRAN-ODT) 4 MG disintegrating tablet        01/28/23 1234    morphine (MSIR) 15 MG tablet  Every 4 hours PRN        01/28/23 1234              Melene Plan, DO 01/28/23 1238

## 2023-02-16 ENCOUNTER — Other Ambulatory Visit (HOSPITAL_BASED_OUTPATIENT_CLINIC_OR_DEPARTMENT_OTHER): Payer: Self-pay

## 2024-02-22 ENCOUNTER — Emergency Department (HOSPITAL_BASED_OUTPATIENT_CLINIC_OR_DEPARTMENT_OTHER)

## 2024-02-22 ENCOUNTER — Other Ambulatory Visit: Payer: Self-pay

## 2024-02-22 ENCOUNTER — Encounter (HOSPITAL_BASED_OUTPATIENT_CLINIC_OR_DEPARTMENT_OTHER): Payer: Self-pay | Admitting: Emergency Medicine

## 2024-02-22 ENCOUNTER — Emergency Department (HOSPITAL_BASED_OUTPATIENT_CLINIC_OR_DEPARTMENT_OTHER)
Admission: EM | Admit: 2024-02-22 | Discharge: 2024-02-22 | Disposition: A | Discharge status: Home / Self Care | Attending: Emergency Medicine | Admitting: Emergency Medicine

## 2024-02-22 DIAGNOSIS — R0602 Shortness of breath: Secondary | ICD-10-CM | POA: Insufficient documentation

## 2024-02-22 DIAGNOSIS — I10 Essential (primary) hypertension: Secondary | ICD-10-CM | POA: Diagnosis not present

## 2024-02-22 LAB — CBC
HCT: 39.4 % (ref 39.0–52.0)
Hemoglobin: 12.6 g/dL — ABNORMAL LOW (ref 13.0–17.0)
MCH: 26.7 pg (ref 26.0–34.0)
MCHC: 32 g/dL (ref 30.0–36.0)
MCV: 83.5 fL (ref 80.0–100.0)
Platelets: 292 10*3/uL (ref 150–400)
RBC: 4.72 MIL/uL (ref 4.22–5.81)
RDW: 14.2 % (ref 11.5–15.5)
WBC: 7.6 10*3/uL (ref 4.0–10.5)
nRBC: 0 % (ref 0.0–0.2)

## 2024-02-22 LAB — BASIC METABOLIC PANEL WITH GFR
Anion gap: 13 (ref 5–15)
BUN: 15 mg/dL (ref 6–20)
CO2: 23 mmol/L (ref 22–32)
Calcium: 8.9 mg/dL (ref 8.9–10.3)
Chloride: 105 mmol/L (ref 98–111)
Creatinine, Ser: 1.11 mg/dL (ref 0.61–1.24)
GFR, Estimated: >60 mL/min (ref >60)
Glucose, Bld: 140 mg/dL — ABNORMAL HIGH (ref 70–99)
Potassium: 3.8 mmol/L (ref 3.5–5.1)
Sodium: 141 mmol/L (ref 135–145)

## 2024-02-22 LAB — RESP PANEL BY RT-PCR (RSV, FLU A&B, COVID)  RVPGX2
Influenza A by PCR: NEGATIVE
Influenza B by PCR: NEGATIVE
Resp Syncytial Virus by PCR: NEGATIVE
SARS Coronavirus 2 by RT PCR: NEGATIVE

## 2024-02-22 MED ORDER — HYDROXYZINE HCL 25 MG PO TABS: 25.0000 mg | ORAL_TABLET | Freq: Four times a day (QID) | ORAL | 0 refills | Status: AC | PRN

## 2024-02-22 NOTE — ED Provider Notes (Signed)
 Emergency Department Provider Note   I have reviewed the triage vital signs and the nursing notes.   HISTORY  Chief Complaint Shortness of Breath   HPI Clifford Daniels is a 58 y.o. male presents emerged department for evaluation of shortness of breath with increasing anxiety in the past 20 to 30 minutes.  No chest discomfort or severe headaches.  No sick contacts.  Patient is currently undergoing treatment with antibiotics for a double ear infection.  He felt like some of his hearing was decreasing which initially caused him distress.  His wife, at bedside, initially thought that this was related to anxiety.  He does not take any medication for anxiety.  He states that he felt more comfortable being checked out this evening.   Past Medical History:  Diagnosis Date   GERD (gastroesophageal reflux disease)    High cholesterol    Hypertension    Sarcoidosis     Review of Systems  Constitutional: No fever/chills Eyes: No visual changes. ENT: No sore throat. Decreased hearing.  Cardiovascular: Denies chest pain. Respiratory: Positive shortness of breath. Gastrointestinal: No abdominal pain.  No nausea, no vomiting.  Skin: Negative for rash. Neurological: Negative for headaches.  ____________________________________________   PHYSICAL EXAM:  VITAL SIGNS: ED Triage Vitals  Encounter Vitals Group     BP 02/22/24 2138 (!) 179/98     Pulse Rate 02/22/24 2138 86     Resp 02/22/24 2138 20     Temp 02/22/24 2138 98.2 F (36.8 C)     Temp src --      SpO2 02/22/24 2138 94 %     Weight 02/22/24 2137 264 lb (119.7 kg)     Height 02/22/24 2137 5' 11 (1.803 m)   Constitutional: Alert and oriented. Well appearing and in no acute distress. Eyes: Conjunctivae are normal.  Head: Atraumatic. Nose: No congestion/rhinnorhea. Mouth/Throat: Mucous membranes are moist.  Neck: No stridor.   Cardiovascular: Normal rate, regular rhythm. Good peripheral circulation. Grossly normal heart  sounds.   Respiratory: Normal respiratory effort.  No retractions. Lungs CTAB. Gastrointestinal: Soft and nontender. No distention.  Musculoskeletal: No lower extremity tenderness nor edema. No gross deformities of extremities. Neurologic:  Normal speech and language. No gross focal neurologic deficits are appreciated.  Skin:  Skin is warm, dry and intact. No rash noted.  ____________________________________________   LABS (all labs ordered are listed, but only abnormal results are displayed)  Labs Reviewed  BASIC METABOLIC PANEL WITH GFR - Abnormal; Notable for the following components:      Result Value   Glucose, Bld 140 (*)    All other components within normal limits  CBC - Abnormal; Notable for the following components:   Hemoglobin 12.6 (*)    All other components within normal limits  RESP PANEL BY RT-PCR (RSV, FLU A&B, COVID)  [id number]   ____________________________________________  EKG  Rate: 81 PR: 179 QTc: 416  Sinus rhythm. Narrow QRS. No STEMI.    ____________________________________________  RADIOLOGY  DG Chest 2 View Result Date: 02/22/2024 CLINICAL DATA:  Shortness of breath and generalized body tightness. EXAM: CHEST - 2 VIEW COMPARISON:  January 28, 2023 FINDINGS: The heart size and mediastinal contours are within normal limits. Low lung volumes are noted with subsequent crowding of the bronchovascular lung markings. No acute infiltrate, pleural effusion or pneumothorax is identified. The visualized skeletal structures are unremarkable. IMPRESSION: No active cardiopulmonary disease. Electronically Signed   By: Suzen Dials M.D.   On: 02/22/2024 21:58  ____________________________________________   PROCEDURES  Procedure(s) performed:   Procedures  None  ____________________________________________   INITIAL IMPRESSION / ASSESSMENT AND PLAN / ED COURSE  Pertinent labs & imaging results that were available during my care of the patient were  reviewed by me and considered in my medical decision making (see chart for details).   This patient is Presenting for Evaluation of SOB, which does require a range of treatment options, and is a complaint that involves a high risk of morbidity and mortality.  The Differential Diagnoses include URI, CAP, ACS, PE, CHF, etc.   Clinical Laboratory Tests Ordered, included CBC without anemia. No AKI. COVID negative.   Radiologic Tests Ordered, included CXR. I independently interpreted the images and agree with radiology interpretation.   Cardiac Monitor Tracing which shows NSR.    Social Determinants of Health Risk patient is a non-smoker.   Medical Decision Making: Summary:  The patient presents to the emergency department for evaluation of shortness of breath.  Symptoms mostly resolved.  Lungs are clear.  No wheezing.  No distress.  No hypoxemia.  Screening blood work is reassuring.  COVID/flu/RSV negative.  Chest x-ray clear.   Reevaluation with update and discussion with patient.  Plan for discharge home with Atarax as needed and close PCP follow-up.  Considered admission but ED workup is largely reassuring.   Patient's presentation is most consistent with acute presentation with potential threat to life or bodily function.   Disposition: discharge  ____________________________________________  FINAL CLINICAL IMPRESSION(S) / ED DIAGNOSES  Final diagnoses:  SOB (shortness of breath)     NEW OUTPATIENT MEDICATIONS STARTED DURING THIS VISIT:  Discharge Medication List as of 02/22/2024 10:48 PM     START taking these medications   Details  hydrOXYzine (ATARAX) 25 MG tablet Take 1 tablet (25 mg total) by mouth every 6 (six) hours as needed for anxiety., Starting Mon 02/22/2024, Normal        Note:  This document was prepared using Dragon voice recognition software and may include unintentional dictation errors.  Fonda Law, MD, [facility]    Clarene Curran, Fonda MATSU,  MD 02/25/24 [phone]

## 2024-02-22 NOTE — Discharge Instructions (Addendum)
 Your lab work and chest x-ray are unremarkable.  Your COVID and flu test are negative.  Please continue your home antibiotics.  I am calling in some medication to take as needed for anxiety.  Return with any new or suddenly worsening symptoms.

## 2024-02-22 NOTE — ED Triage Notes (Signed)
 Pt POV steady gait- c/o generalize body tightness, ShoB, increased anxiety x 20 min PTA. Recently dx with  double ear infection, denies known sick contacts, fever.   Denies respiratory hx,

## 2024-09-27 ENCOUNTER — Emergency Department (HOSPITAL_BASED_OUTPATIENT_CLINIC_OR_DEPARTMENT_OTHER): Admission: EM | Admit: 2024-09-27 | Discharge: 2024-09-27 | Discharge status: Against Medical Advice

## 2024-09-27 ENCOUNTER — Encounter (HOSPITAL_BASED_OUTPATIENT_CLINIC_OR_DEPARTMENT_OTHER): Payer: Self-pay

## 2024-09-27 ENCOUNTER — Other Ambulatory Visit: Payer: Self-pay

## 2024-09-27 DIAGNOSIS — R1031 Right lower quadrant pain: Secondary | ICD-10-CM | POA: Insufficient documentation

## 2024-09-27 DIAGNOSIS — Z5321 Procedure and treatment not carried out due to patient leaving prior to being seen by health care provider: Secondary | ICD-10-CM | POA: Insufficient documentation

## 2024-09-27 LAB — URINALYSIS, ROUTINE W REFLEX MICROSCOPIC
Bilirubin Urine: NEGATIVE
Glucose, UA: NEGATIVE mg/dL
Ketones, ur: NEGATIVE mg/dL
Leukocytes,Ua: NEGATIVE
Nitrite: NEGATIVE
Protein, ur: NEGATIVE mg/dL
Specific Gravity, Urine: 1.02 (ref 1.005–1.030)
pH: 6 (ref 5.0–8.0)

## 2024-09-27 LAB — COMPREHENSIVE METABOLIC PANEL WITH GFR
ALT: 25 U/L (ref 0–44)
AST: 20 U/L (ref 15–41)
Albumin: 4.5 g/dL (ref 3.5–5.0)
Alkaline Phosphatase: 63 U/L (ref 38–126)
Anion gap: 13 (ref 5–15)
BUN: 16 mg/dL (ref 6–20)
CO2: 23 mmol/L (ref 22–32)
Calcium: 9.5 mg/dL (ref 8.9–10.3)
Chloride: 104 mmol/L (ref 98–111)
Creatinine, Ser: 1.18 mg/dL (ref 0.61–1.24)
GFR, Estimated: >60 mL/min
Glucose, Bld: 94 mg/dL (ref 70–99)
Potassium: 3.8 mmol/L (ref 3.5–5.1)
Sodium: 140 mmol/L (ref 135–145)
Total Bilirubin: 0.4 mg/dL (ref 0.0–1.2)
Total Protein: 7.7 g/dL (ref 6.5–8.1)

## 2024-09-27 LAB — CBC
HCT: 41 % (ref 39.0–52.0)
Hemoglobin: 13.3 g/dL (ref 13.0–17.0)
MCH: 26.7 pg (ref 26.0–34.0)
MCHC: 32.4 g/dL (ref 30.0–36.0)
MCV: 82.2 fL (ref 80.0–100.0)
Platelets: 301 10*3/uL (ref 150–400)
RBC: 4.99 MIL/uL (ref 4.22–5.81)
RDW: 13.7 % (ref 11.5–15.5)
WBC: 7.3 10*3/uL (ref 4.0–10.5)
nRBC: 0 % (ref 0.0–0.2)

## 2024-09-27 LAB — URINALYSIS, MICROSCOPIC (REFLEX)

## 2024-09-27 LAB — LIPASE, BLOOD: Lipase: 72 U/L — ABNORMAL HIGH (ref 11–51)
